# Patient Record
Sex: Female | Born: 1975
Health system: Southern US, Community
[De-identification: ages and names within clinical notes are randomized; demographics above are authoritative.]

## PROBLEM LIST (undated history)

## (undated) DIAGNOSIS — J45909 Unspecified asthma, uncomplicated: Secondary | ICD-10-CM

## (undated) DIAGNOSIS — K219 Gastro-esophageal reflux disease without esophagitis: Secondary | ICD-10-CM

## (undated) DIAGNOSIS — G8929 Other chronic pain: Secondary | ICD-10-CM

## (undated) DIAGNOSIS — G43909 Migraine, unspecified, not intractable, without status migrainosus: Secondary | ICD-10-CM

## (undated) HISTORY — PX: APPENDECTOMY: SHX54

## (undated) HISTORY — DX: Gastro-esophageal reflux disease without esophagitis: K21.9

---

## 1998-06-26 ENCOUNTER — Emergency Department (HOSPITAL_COMMUNITY): Admission: EM | Admit: 1998-06-26 | Discharge: 1998-06-26 | Payer: Self-pay | Admitting: *Deleted

## 1998-09-15 ENCOUNTER — Emergency Department (HOSPITAL_COMMUNITY): Admission: EM | Admit: 1998-09-15 | Discharge: 1998-09-15 | Payer: Self-pay | Admitting: Emergency Medicine

## 2003-07-30 ENCOUNTER — Emergency Department (HOSPITAL_COMMUNITY): Admission: EM | Admit: 2003-07-30 | Discharge: 2003-07-31 | Payer: Self-pay | Admitting: Emergency Medicine

## 2008-03-10 ENCOUNTER — Emergency Department (HOSPITAL_BASED_OUTPATIENT_CLINIC_OR_DEPARTMENT_OTHER): Admission: EM | Admit: 2008-03-10 | Discharge: 2008-03-10 | Payer: Self-pay | Admitting: Emergency Medicine

## 2009-11-15 ENCOUNTER — Ambulatory Visit: Payer: Self-pay | Admitting: Diagnostic Radiology

## 2009-11-15 ENCOUNTER — Emergency Department (HOSPITAL_BASED_OUTPATIENT_CLINIC_OR_DEPARTMENT_OTHER): Admission: EM | Admit: 2009-11-15 | Discharge: 2009-11-15 | Payer: Self-pay | Admitting: Emergency Medicine

## 2010-05-22 ENCOUNTER — Emergency Department (HOSPITAL_BASED_OUTPATIENT_CLINIC_OR_DEPARTMENT_OTHER): Admission: EM | Admit: 2010-05-22 | Discharge: 2010-05-22 | Payer: Self-pay | Admitting: Emergency Medicine

## 2010-05-28 ENCOUNTER — Emergency Department (HOSPITAL_BASED_OUTPATIENT_CLINIC_OR_DEPARTMENT_OTHER): Admission: EM | Admit: 2010-05-28 | Discharge: 2010-05-28 | Payer: Self-pay | Admitting: Emergency Medicine

## 2010-06-03 ENCOUNTER — Encounter: Payer: Self-pay | Admitting: Family Medicine

## 2010-07-21 ENCOUNTER — Ambulatory Visit: Payer: Self-pay | Admitting: Family Medicine

## 2010-07-21 DIAGNOSIS — R946 Abnormal results of thyroid function studies: Secondary | ICD-10-CM

## 2010-07-21 DIAGNOSIS — K219 Gastro-esophageal reflux disease without esophagitis: Secondary | ICD-10-CM | POA: Insufficient documentation

## 2010-07-21 DIAGNOSIS — G43829 Menstrual migraine, not intractable, without status migrainosus: Secondary | ICD-10-CM | POA: Insufficient documentation

## 2010-07-21 DIAGNOSIS — A048 Other specified bacterial intestinal infections: Secondary | ICD-10-CM | POA: Insufficient documentation

## 2010-07-21 DIAGNOSIS — T7840XA Allergy, unspecified, initial encounter: Secondary | ICD-10-CM | POA: Insufficient documentation

## 2010-07-22 ENCOUNTER — Telehealth (INDEPENDENT_AMBULATORY_CARE_PROVIDER_SITE_OTHER): Payer: Self-pay | Admitting: *Deleted

## 2010-07-22 ENCOUNTER — Encounter (INDEPENDENT_AMBULATORY_CARE_PROVIDER_SITE_OTHER): Payer: Self-pay | Admitting: *Deleted

## 2010-07-23 ENCOUNTER — Telehealth (INDEPENDENT_AMBULATORY_CARE_PROVIDER_SITE_OTHER): Payer: Self-pay | Admitting: *Deleted

## 2010-08-26 ENCOUNTER — Ambulatory Visit: Payer: Self-pay | Admitting: Family Medicine

## 2010-08-26 ENCOUNTER — Ambulatory Visit: Payer: Self-pay | Admitting: Diagnostic Radiology

## 2010-08-26 ENCOUNTER — Ambulatory Visit (HOSPITAL_BASED_OUTPATIENT_CLINIC_OR_DEPARTMENT_OTHER): Admission: RE | Admit: 2010-08-26 | Discharge: 2010-08-26 | Payer: Self-pay | Admitting: Family Medicine

## 2010-08-26 DIAGNOSIS — R0989 Other specified symptoms and signs involving the circulatory and respiratory systems: Secondary | ICD-10-CM | POA: Insufficient documentation

## 2010-08-26 DIAGNOSIS — F4321 Adjustment disorder with depressed mood: Secondary | ICD-10-CM

## 2010-08-27 ENCOUNTER — Telehealth (INDEPENDENT_AMBULATORY_CARE_PROVIDER_SITE_OTHER): Payer: Self-pay | Admitting: *Deleted

## 2010-08-27 LAB — CONVERTED CEMR LAB
ALT: 43 units/L — ABNORMAL HIGH (ref 0–35)
BUN: 7 mg/dL (ref 6–23)
Basophils Absolute: 0 10*3/uL (ref 0.0–0.1)
Bilirubin, Direct: 0.1 mg/dL (ref 0.0–0.3)
Cholesterol: 173 mg/dL (ref 0–200)
Creatinine, Ser: 0.6 mg/dL (ref 0.4–1.2)
Eosinophils Relative: 1.3 % (ref 0.0–5.0)
GFR calc non Af Amer: 115.74 mL/min (ref >60)
LDL Cholesterol: 110 mg/dL — ABNORMAL HIGH (ref 0–99)
MCV: 84.2 fL (ref 78.0–100.0)
Monocytes Absolute: 0.5 10*3/uL (ref 0.1–1.0)
Monocytes Relative: 6.8 % (ref 3.0–12.0)
Neutrophils Relative %: 71.1 % (ref 43.0–77.0)
Platelets: 310 10*3/uL (ref 150.0–400.0)
RDW: 13 % (ref 11.5–14.6)
Total Bilirubin: 0.7 mg/dL (ref 0.3–1.2)
Triglycerides: 91 mg/dL (ref 0.0–149.0)
VLDL: 18.2 mg/dL (ref 0.0–40.0)
WBC: 7.8 10*3/uL (ref 4.5–10.5)

## 2010-11-04 ENCOUNTER — Emergency Department (HOSPITAL_BASED_OUTPATIENT_CLINIC_OR_DEPARTMENT_OTHER)
Admission: EM | Admit: 2010-11-04 | Discharge: 2010-11-04 | Payer: Self-pay | Source: Home / Self Care | Admitting: Emergency Medicine

## 2010-11-04 LAB — BASIC METABOLIC PANEL
BUN: 11 mg/dL (ref 6–23)
Calcium: 9.2 mg/dL (ref 8.4–10.5)
Creatinine, Ser: 0.6 mg/dL (ref 0.4–1.2)
GFR calc non Af Amer: >60 mL/min (ref >60)
Glucose, Bld: 90 mg/dL (ref 70–99)

## 2010-11-09 NOTE — Assessment & Plan Note (Signed)
Summary: CPX AND LABS/CDJ   Vital Signs:  Patient profile:   35 year old female Height:      58 inches Weight:      160 pounds BMI:     33.56 Pulse rate:   106 / minute BP sitting:   120 / 70  (left arm)  Vitals Entered By: Doristine Devoid CMA (August 26, 2010 8:14 AM) CC: CPX AND LABS   History of Present Illness: 35 yo woman here today for CPE.  GYN- Dr Gerilyn Pilgrim, UTD on pap.    1) mood swings- finds herself 'overwhelmed', 'i'm short w/ people', 'i don't feel right'.  having difficulty staying asleep.  denies increased eating.  denies withdrawing from people and things she enjoys.  feels this is related to her ongoing allergic rxns.  Preventive Screening-Counseling & Management  Alcohol-Tobacco     Alcohol drinks/day: <1     Smoking Status: never  Caffeine-Diet-Exercise     Does Patient Exercise: no      Sexual History:  currently monogamous.        Drug Use:  never.    Current Medications (verified): 1)  Maxalt 5 Mg Tabs (Rizatriptan Benzoate) .Marland Kitchen.. 1 Tab As Needed For Migraine.  May Repeat in 2 Hrs.  Disp 1 Month. 2)  Zyrtec Allergy 10 Mg Tabs (Cetirizine Hcl) .... Take Two Times A Day 3)  Ranitidine Hcl 150 Mg Caps (Ranitidine Hcl) .... Take Tablet Daily 4)  Doxepin Hcl 10 Mg Caps (Doxepin Hcl) .... Take Nightly As Directed  Allergies (verified): 1)  ! Amoxicillin  Past History:  Past medical, surgical, family and social histories (including risk factors) reviewed, and no changes noted (except as noted below).  Past Medical History: Reviewed history from 07/21/2010 and no changes required. allergic skin rash migraines GERD  Past Surgical History: Reviewed history from 07/21/2010 and no changes required. Appendectomy Caesarean section  Family History: Reviewed history from 07/21/2010 and no changes required. CAD-no HTN-no DM-no STROKE-maternal grandmother COLON CA-no BREAST CA-no  Social History: Reviewed history from 07/21/2010 and no changes  required. married 3 girls scheduler at [employer]  Review of Systems       The patient complains of chest pain, peripheral edema, and severe indigestion/heartburn.  The patient denies anorexia, fever, weight loss, weight gain, vision loss, decreased hearing, hoarseness, syncope, dyspnea on exertion, prolonged cough, headaches, abdominal pain, melena, hematochezia, hematuria, suspicious skin lesions, depression, abnormal bleeding, enlarged lymph nodes, and breast masses.         CP w/ reflux edema w/ allergic rxns  Physical Exam  General:  obviously uncomfortable Head:  Normocephalic and atraumatic without obvious abnormalities. No apparent alopecia or balding. Eyes:  No corneal or conjunctival inflammation noted. EOMI. Perrla. Funduscopic exam benign, without hemorrhages, exudates or papilledema. Vision grossly normal. Ears:  External ear exam shows no significant lesions or deformities.  Otoscopic examination reveals clear canals, tympanic membranes are intact bilaterally without bulging, retraction, inflammation or discharge. Hearing is grossly normal bilaterally. Nose:  External nasal examination shows no deformity or inflammation. Nasal mucosa are pink and moist without lesions or exudates. Mouth:  large tonsils bilaterally, otherwise normal during visit lips became red and started swelling Neck:  No deformities, masses, or tenderness noted. Breasts:  deferred to GYN Lungs:  coarse BS R upper lobe otherwise CTA Heart:  Normal rate and regular rhythm. S1 and S2 normal without gallop, murmur, click, rub or other extra sounds. Abdomen:  Bowel sounds positive,abdomen soft and non-tender without masses, organomegaly  or hernias noted. Genitalia:  deferred to GYN Pulses:  +2 carotid, radial, DP Extremities:  no C/C/E Neurologic:  alert & oriented X3, cranial nerves II-XII intact, strength normal in all extremities, gait normal, and DTRs symmetrical and normal.   Skin:  + dermatographia, hives,  lips starting to swell Cervical Nodes:  No lymphadenopathy noted Axillary Nodes:  No palpable lymphadenopathy Psych:  anxious, uncomfortable   Impression & Recommendations:  Problem # 1:  PHYSICAL EXAMINATION (ICD-V70.0) Assessment New pt's PE WNL w/ exception of hives and dermatographia.  check labs. Orders: Venipuncture (83151) T-Vitamin D (25-Hydroxy) (76160-73710) Specimen Handling (62694) TLB-Lipid Panel (80061-LIPID) TLB-BMP (Basic Metabolic Panel-BMET) (80048-METABOL) TLB-CBC Platelet - w/Differential (85025-CBCD) TLB-Hepatic/Liver Function Pnl (80076-HEPATIC)  Problem # 2:  ADJUSTMENT DISORDER WITH DEPRESSED MOOD (ICD-309.0) Assessment: New pt obviously upset and anxious about her ongoing allergic rxns.  at this time will not start meds as this is an understandable response to her physical discomfort but advised her to call me if she feels at any time she would like help w/ her mood.  Pt expresses understanding and is in agreement w/ this plan.  Problem # 3:  ALLERGIC REACTION (ICD-995.3) Assessment: Unchanged pt developing worsening hives during exam and then had lip swelling.  called allergist to ask permission to tx w/ steroids given the involvement of the lips.  Depo medrol given. Orders: Depo- Medrol 80mg  (J1040) Admin of Therapeutic Inj  intramuscular or subcutaneous (85462)  Problem # 4:  ABNORMAL BREATH SOUNDS (ICD-786.7) Assessment: New coarse BS in RUL.  check xray. Orders: T-2 View CXR (71020TC)  Problem # 5:  GERD (ICD-530.81) Assessment: Unchanged  Her updated medication list for this problem includes:    Ranitidine Hcl 150 Mg Caps (Ranitidine hcl) .Marland Kitchen... Take tablet daily  Complete Medication List: 1)  Maxalt 5 Mg Tabs (Rizatriptan benzoate) .Marland Kitchen.. 1 tab as needed for migraine.  may repeat in 2 hrs.  disp 1 month. 2)  Zyrtec Allergy 10 Mg Tabs (Cetirizine hcl) .... Take two times a day 3)  Ranitidine Hcl 150 Mg Caps (Ranitidine hcl) .... Take tablet  daily 4)  Doxepin Hcl 10 Mg Caps (Doxepin hcl) .... Take nightly as directed  Patient Instructions: 1)  Schedule a lab visit in 6 weeks to recheck your thyroid (TSH, free T3/T4- dx 794.5) 2)  If your mood continues to be an issue- call me!!  I can certainly see how you'd be depressed! 3)  Please go get your chest xray at [address] or the [facility] at [address]- we'll call you with the results 4)  We'll call you with your lab results 5)  If you continue to swell- please go to your allergy appt early 6)  Call with any questions or concerns 7)  Hang in there! 8)  Happy Holidays!   Medication Administration  Injection # 1:    Medication: Depo- Medrol 80mg     Diagnosis: ALLERGIC REACTION (ICD-995.3)    Route: IM    Site: R deltoid    Exp Date: 04/10/2011    Lot #: [id number]    Mfr: Pharmacia    Given by: Doristine Devoid CMA (August 26, 2010 9:50 AM)  Orders Added: 1)  T-2 View CXR [71020TC] 2)  Venipuncture [09381] 3)  T-Vitamin D (25-Hydroxy) [82993-71696] 4)  Specimen Handling [99000] 5)  Depo- Medrol 80mg  [J1040] 6)  Admin of Therapeutic Inj  intramuscular or subcutaneous [96372] 7)  TLB-Lipid Panel [80061-LIPID] 8)  TLB-BMP (Basic Metabolic Panel-BMET) [  80048-METABOL] 9)  TLB-CBC Platelet - w/Differential [85025-CBCD] 10)  TLB-Hepatic/Liver Function Pnl [80076-HEPATIC] 11)  Est. Patient 18-39 years [99395]     Medication Administration  Injection # 1:    Medication: Depo- Medrol 80mg     Diagnosis: ALLERGIC REACTION (ICD-995.3)    Route: IM    Site: R deltoid    Exp Date: 04/10/2011    Lot #: [id number]    Mfr: Pharmacia    Given by: Doristine Devoid CMA (August 26, 2010 9:50 AM)  Orders Added: 1)  T-2 View CXR [71020TC] 2)  Venipuncture [60454] 3)  T-Vitamin D (25-Hydroxy) [09811-91478] 4)  Specimen Handling [99000] 5)  Depo- Medrol 80mg  [J1040] 6)  Admin of Therapeutic Inj  intramuscular or subcutaneous [96372] 7)  TLB-Lipid Panel  [80061-LIPID] 8)  TLB-BMP (Basic Metabolic Panel-BMET) [80048-METABOL] 9)  TLB-CBC Platelet - w/Differential [85025-CBCD] 10)  TLB-Hepatic/Liver Function Pnl [80076-HEPATIC] 11)  Est. Patient 18-39 years [29562]

## 2010-11-09 NOTE — Letter (Signed)
Summary: Work Dietitian at [employer]  [address] [place], [place] [address]   Phone: [phone]  Fax: [phone]    Today's Date: July 22, 2010  Name of Patient: Michele Roberts  The above named patient had a medical visit today at:  am / 3:00 pm.  Please take this into consideration when reviewing the time away from work/school.    Special Instructions:  [ X ] None  [  ] To be off the remainder of today, returning to the normal work / school schedule tomorrow.  [  ] To be off until the next scheduled appointment on ______________________.  [  ] Other ________________________________________________________________ ________________________________________________________________________   Sincerely yours,   Doristine Devoid CMA

## 2010-11-09 NOTE — Progress Notes (Signed)
Summary: labs  Phone Note Outgoing Call   Call placed by: Doristine Devoid CMA,  August 27, 2010 3:10 PM Call placed to: Patient Summary of Call: level is low.  needs to start 50,000 units weekly x12 weeks and then recheck level  Follow-up for Phone Call        left message on machine .......Marland KitchenDoristine Devoid CMA  August 27, 2010 3:10 PM   left message on machine ...........Marland KitchenDoristine Devoid CMA  August 31, 2010 2:00 PM   will mail information and send prescription to the pharmacy.........Marland KitchenDoristine Devoid CMA  September 07, 2010 8:59 AM     New/Updated Medications: VITAMIN D (ERGOCALCIFEROL) 50000 UNIT CAPS (ERGOCALCIFEROL) take one tablet weekly x12 weeks Prescriptions: VITAMIN D (ERGOCALCIFEROL) 50000 UNIT CAPS (ERGOCALCIFEROL) take one tablet weekly x12 weeks  #12 x 0   Entered by:   Doristine Devoid CMA   Authorized by:   Neena Rhymes MD   Signed by:   Doristine Devoid CMA on 09/07/2010   Method used:   [facility]  [address] [phone][facility]ail[facility]       [address]       [place], [place]  [address]       Ph: [phone] or [phone]       Fax: [phone]   RxID:   [id number]

## 2010-11-09 NOTE — Progress Notes (Signed)
Summary: more specific instruction for maxalt  Phone Note Refill Request Message from:  Fax from Pharmacy on July 22, 2010 9:10 AM  Refills Requested: Medication #1:  MAXALT 5 MG TABS 1 tab as needed for migraine.  may repeat in 2 hrs.  disp 1 month. [facility] [phone] note "quantity 1 month please be more specific"  Initial call taken by: Okey Regal [place],  July 22, 2010 9:13 AM  Follow-up for Phone Call        informed a quantity of 12 or whatever insurance will allow.......Marland KitchenDoristine Devoid CMA  July 22, 2010 9:28 AM

## 2010-11-09 NOTE — Progress Notes (Signed)
Summary: lmom 10-14,10-19 TRY LATER-lab results-  Phone Note Outgoing Call   Call placed by: [facility] CMA,  July 23, 2010 10:22 AM Details for Reason: pt is allergic to dogs, cats, and grass- but dogs worse than the others.  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  should avoid shellfish if possible Summary of Call: left message to call office.................Marland KitchenFelecia Deloach CMA  July 23, 2010 10:22 AM  Tried to call pt message on phone stating pt unable to answer phone pls try again later. Will try later..........Marland KitchenFelecia Deloach CMA  July 28, 2010 10:16 AM    Follow-up for Phone Call        spoke w/ patient aware of labs wanted to know what she should do informed patient she could f/u w/ allergist she would like to be referred to another allergist to further evaluate and appt scheduled for cpx.......Marland KitchenDoristine Devoid CMA  July 28, 2010 2:33 PM

## 2010-11-09 NOTE — Assessment & Plan Note (Signed)
Summary: NEW TO ESTAB, HAS RASH PROBLEMS///SPH   Vital Signs:  Patient profile:   35 year old female Height:      58 inches Weight:      158 pounds BMI:     33.14 Pulse rate:   97 / minute BP sitting:   112 / 70  (left arm)  Vitals Entered By: Doristine Devoid CMA (July 21, 2010 3:43 PM) CC: NEW EST- breakout all over body x2 months ago   History of Present Illness: 35 yo woman here today to establish care.  previous MD- [facility].  Migraines- takes Maxalt as needed.  typically menstrual migraines.  will take Excedrin for Migraine early in onset.  has never seen neurology.  will have auras.  + nausea, vomiting.  will end up in ER  ~2x/yr for severe headaches.  has had MRI- normal.  rash- saw Dr Beaulah Dinning (allergist).  was going to have allergy test but was taking Zyrtec.  was found to be H pylori +, this was treated.  if she stops taking Zyrtec will develop rash 'all over'.  rash looks like 'scratches'- 'red streaks all over my body'.  insomnia- taking doxepin for sleep and itching.  not taking every night, only as needed (typically when rash is present).  reports good relief.  abnormal thyroid test- according to labs done on 8/25 pt has slightly elevated T4 at 12.8  + H pylori- pt reports she has taken the medication as directed and would like to know if this has resolved.   Preventive Screening-Counseling & Management  Alcohol-Tobacco     Alcohol drinks/day: <1     Smoking Status: never  Caffeine-Diet-Exercise     Does Patient Exercise: no      Sexual History:  currently monogamous.        Drug Use:  never.    Current Medications (verified): 1)  Maxalt 5 Mg Tabs (Rizatriptan Benzoate) .Marland Kitchen.. 1 Tab As Needed For Migraine.  May Repeat in 2 Hrs.  Disp 1 Month. 2)  Zyrtec Allergy 10 Mg Tabs (Cetirizine Hcl) .... Take Two Times A Day 3)  Ranitidine Hcl 150 Mg Caps (Ranitidine Hcl) .... Take Tablet Daily 4)  Doxepin Hcl 10 Mg Caps (Doxepin Hcl) .... Take Nightly As  Directed  Allergies (verified): 1)  ! 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 Lungs:  Normal respiratory effort, chest expands symmetrically. Lungs are clear to auscultation, no crackles or wheezes. Heart:  Normal rate and regular rhythm. S1 and S2 normal without gallop, murmur, click, rub or other extra sounds. Abdomen:  soft, NT/ND, +BS Pulses:  +2 carotid, radial, DP Extremities:  no C/C/E Neurologic:  alert & oriented X3, cranial nerves II-XII intact, strength normal in all extremities, gait normal, and DTRs symmetrical and normal.   Skin:  no visible rash today   Impression & Recommendations:  Problem # 1:  ALLERGIC REACTION (ICD-995.3) Assessment New pt's rash and dermatographia consistent w/ intense allergic rxn.  zyrtec is controlling  sxs currently but pt would like to know what is triggering response.  will do allergy testing to attempt to figure this out. Orders: Venipuncture (51025) T-Food Allergy Profile Specific IgE (86003/[id number]) T-Simms Allergy Panel (86003/[id number]) Specimen Handling (85277)  Problem # 2:  MIGRAINE, MENSTRUAL (ICD-346.40) Assessment: New pt has had normal MRI.  migraines consistent w/ menstrual migraines.  continue maxalt as needed. Her updated medication list for this problem includes:    Maxalt 5 Mg Tabs (Rizatriptan benzoate) .Marland Kitchen... 1 tab as needed for  migraine.  may repeat in 2 hrs.  disp 1 month.  Problem # 3:  HELICOBACTER PYLORI INFECTION (ICD-041.86) Assessment: New pt w/ recent H pylori.  took meds as directed.  would like test of cure. Orders: TLB-H. Pylori Abs(Helicobacter Pylori) (86677-HELICO) Specimen Handling (82423)  Problem # 4:  THYROID FUNCTION TEST, ABNORMAL (ICD-794.5) Assessment: New pt's T4 elevated on recent lab work.  repeat today. Orders: TLB-TSH (Thyroid Stimulating Hormone) (84443-TSH) TLB-T4 (Thyrox), Free [phone]) TLB-T3, Free (Triiodothyronine) (84481-T3FREE) Specimen Handling (40086)  Complete Medication List: 1)  Maxalt 5 Mg Tabs (Rizatriptan benzoate) .Marland Kitchen.. 1 tab as needed for migraine.  may repeat in 2 hrs.  disp 1 month. 2)  Zyrtec Allergy 10 Mg Tabs (Cetirizine hcl) .... Take two times a day 3)  Ranitidine Hcl 150 Mg Caps (Ranitidine hcl) .... Take tablet daily 4)  Doxepin Hcl 10 Mg Caps (Doxepin hcl) .... Take nightly as directed  Patient Instructions: 1)  Please schedule your complete physical at your convenience- do not eat before this appt 2)  We'll notify you of your lab results 3)  Keep taking the Zyrtec EVERY DAY! 4)  Call with any questions or concerns 5)  Hang in there! 6)  Welcome!  We're glad to have you! Prescriptions: MAXALT 5 MG TABS (RIZATRIPTAN BENZOATE) 1 tab as needed for migraine.  may repeat in 2 hrs.  disp 1 month.  #1 month x 6   Entered and Authorized by:   Neena Rhymes MD   Signed by:   Neena Rhymes MD on 07/21/2010   Method used:   [facility]        [facility]  [address] [phone][facility]       [address]       [place], [place]  [address]       Ph: [phone] or [phone]       Fax: [phone]   RxID:   [id number]

## 2010-11-11 NOTE — Consult Note (Signed)
Summary: [facility]  [facility]   Imported By: Lanelle Bal 10/07/2010 09:30:39  _____________________________________________________________________  External Attachment:    Type:   Image     Comment:   External Document

## 2011-02-01 ENCOUNTER — Telehealth: Payer: Self-pay | Admitting: Family Medicine

## 2011-02-01 ENCOUNTER — Emergency Department (HOSPITAL_BASED_OUTPATIENT_CLINIC_OR_DEPARTMENT_OTHER)
Admission: EM | Admit: 2011-02-01 | Discharge: 2011-02-01 | Disposition: A | Discharge status: Home / Self Care | Payer: BC Managed Care – PPO | Attending: Emergency Medicine | Admitting: Emergency Medicine

## 2011-02-01 ENCOUNTER — Emergency Department (INDEPENDENT_AMBULATORY_CARE_PROVIDER_SITE_OTHER): Payer: BC Managed Care – PPO

## 2011-02-01 DIAGNOSIS — M542 Cervicalgia: Secondary | ICD-10-CM

## 2011-02-01 DIAGNOSIS — R51 Headache: Secondary | ICD-10-CM

## 2011-02-01 NOTE — Telephone Encounter (Signed)
Patient called to see if Dr Beverely Low could see her today---yesterday she had pain started in rt hand and going up to neck, felt pressure, when pressure released, she felt arm was heavy and cold---feeling lightheaded, now rt leg is showing same symptoms, also has strange headache---says she has a "strange headache"

## 2011-02-01 NOTE — Telephone Encounter (Signed)
Agree w/ ER evaluation

## 2011-02-01 NOTE — Telephone Encounter (Signed)
Spoke w/ pt informed she should be seen in ER pt ok'd information and will go to [facility].

## 2011-06-06 ENCOUNTER — Encounter: Payer: Self-pay | Admitting: Family Medicine

## 2011-06-06 ENCOUNTER — Emergency Department (INDEPENDENT_AMBULATORY_CARE_PROVIDER_SITE_OTHER): Payer: BC Managed Care – PPO

## 2011-06-06 ENCOUNTER — Emergency Department (HOSPITAL_BASED_OUTPATIENT_CLINIC_OR_DEPARTMENT_OTHER)
Admission: EM | Admit: 2011-06-06 | Discharge: 2011-06-06 | Disposition: A | Discharge status: Home / Self Care | Payer: BC Managed Care – PPO | Attending: Emergency Medicine | Admitting: Emergency Medicine

## 2011-06-06 DIAGNOSIS — M542 Cervicalgia: Secondary | ICD-10-CM

## 2011-06-06 DIAGNOSIS — S161XXA Strain of muscle, fascia and tendon at neck level, initial encounter: Secondary | ICD-10-CM

## 2011-06-06 DIAGNOSIS — S139XXA Sprain of joints and ligaments of unspecified parts of neck, initial encounter: Secondary | ICD-10-CM | POA: Insufficient documentation

## 2011-06-06 DIAGNOSIS — S20219A Contusion of unspecified front wall of thorax, initial encounter: Secondary | ICD-10-CM | POA: Insufficient documentation

## 2011-06-06 DIAGNOSIS — M545 Low back pain: Secondary | ICD-10-CM

## 2011-06-06 DIAGNOSIS — Y92410 Unspecified street and highway as the place of occurrence of the external cause: Secondary | ICD-10-CM | POA: Insufficient documentation

## 2011-06-06 DIAGNOSIS — M47817 Spondylosis without myelopathy or radiculopathy, lumbosacral region: Secondary | ICD-10-CM

## 2011-06-06 DIAGNOSIS — R079 Chest pain, unspecified: Secondary | ICD-10-CM

## 2011-06-06 DIAGNOSIS — R51 Headache: Secondary | ICD-10-CM

## 2011-06-06 HISTORY — DX: Migraine, unspecified, not intractable, without status migrainosus: G43.909

## 2011-06-06 LAB — URINALYSIS, ROUTINE W REFLEX MICROSCOPIC
Glucose, UA: NEGATIVE mg/dL
Ketones, ur: NEGATIVE mg/dL
Leukocytes, UA: NEGATIVE
Nitrite: NEGATIVE
Specific Gravity, Urine: 1.007 (ref 1.005–1.030)
pH: 7 (ref 5.0–8.0)

## 2011-06-06 LAB — PREGNANCY, URINE: Preg Test, Ur: NEGATIVE

## 2011-06-06 MED ORDER — CYCLOBENZAPRINE HCL 10 MG PO TABS: 10.0000 mg | ORAL_TABLET | Freq: Two times a day (BID) | ORAL | Status: AC | PRN

## 2011-06-06 MED ORDER — HYDROCODONE-ACETAMINOPHEN 5-325 MG PO TABS
2.0000 | ORAL_TABLET | Freq: Once | ORAL | Status: AC
  Administered 2011-06-06: 2 via ORAL
  Filled 2011-06-06: qty 2

## 2011-06-06 MED ORDER — ONDANSETRON 8 MG PO TBDP
8.0000 mg | ORAL_TABLET | Freq: Once | ORAL | Status: AC
  Administered 2011-06-06: 8 mg via ORAL
  Filled 2011-06-06: qty 1

## 2011-06-06 MED ORDER — IBUPROFEN 800 MG PO TABS: 800.0000 mg | ORAL_TABLET | Freq: Three times a day (TID) | ORAL | Status: AC
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
## 2011-06-06 NOTE — ED Notes (Signed)
Pt attempting to void for urine specimen at this time.

## 2011-06-06 NOTE — ED Notes (Signed)
MD at bedside. 

## 2011-07-07 LAB — POCT CARDIAC MARKERS
Myoglobin, poc: 34.6
Operator id: 5513
Troponin i, poc: <0.05

## 2011-07-07 LAB — BASIC METABOLIC PANEL
Calcium: 9.3
GFR calc Af Amer: >60
GFR calc non Af Amer: >60
Potassium: 3.7
Sodium: 141

## 2011-07-07 LAB — DIFFERENTIAL
Basophils Absolute: 0.1
Eosinophils Relative: 2
Lymphocytes Relative: 25
Lymphs Abs: 1.8
Monocytes Absolute: 0.5
Neutro Abs: 4.9

## 2011-07-07 LAB — CBC
HCT: 35.9 — ABNORMAL LOW
Hemoglobin: 12.5
RBC: 4.35
WBC: 7.4

## 2011-07-07 LAB — PROTIME-INR: INR: 1.1

## 2011-08-25 ENCOUNTER — Encounter: Payer: Self-pay | Admitting: Family Medicine

## 2011-08-29 ENCOUNTER — Ambulatory Visit (INDEPENDENT_AMBULATORY_CARE_PROVIDER_SITE_OTHER): Payer: BC Managed Care – PPO | Admitting: Family Medicine

## 2011-08-29 ENCOUNTER — Encounter: Payer: Self-pay | Admitting: Family Medicine

## 2011-08-29 DIAGNOSIS — F4321 Adjustment disorder with depressed mood: Secondary | ICD-10-CM

## 2011-08-29 DIAGNOSIS — K589 Irritable bowel syndrome without diarrhea: Secondary | ICD-10-CM | POA: Insufficient documentation

## 2011-08-29 DIAGNOSIS — Z0000 Encounter for general adult medical examination without abnormal findings: Secondary | ICD-10-CM | POA: Insufficient documentation

## 2011-08-29 LAB — CBC WITH DIFFERENTIAL/PLATELET
Basophils Absolute: 0 10*3/uL (ref 0.0–0.1)
Eosinophils Relative: 1.2 % (ref 0.0–5.0)
HCT: 34.7 % — ABNORMAL LOW (ref 36.0–46.0)
Lymphs Abs: 1.9 10*3/uL (ref 0.7–4.0)
MCV: 84.2 fl (ref 78.0–100.0)
Monocytes Absolute: 0.5 10*3/uL (ref 0.1–1.0)
Monocytes Relative: 6.6 % (ref 3.0–12.0)
Neutrophils Relative %: 65.4 % (ref 43.0–77.0)
Platelets: 281 10*3/uL (ref 150.0–400.0)
RDW: 13.1 % (ref 11.5–14.6)
WBC: 7.4 10*3/uL (ref 4.5–10.5)

## 2011-08-29 LAB — HEPATIC FUNCTION PANEL
AST: 22 U/L (ref 0–37)
Alkaline Phosphatase: 57 U/L (ref 39–117)
Bilirubin, Direct: 0 mg/dL (ref 0.0–0.3)

## 2011-08-29 LAB — BASIC METABOLIC PANEL
Calcium: 8.7 mg/dL (ref 8.4–10.5)
GFR: 115.1 mL/min (ref >60.00)
Glucose, Bld: 95 mg/dL (ref 70–99)
Potassium: 3.7 mEq/L (ref 3.5–5.1)
Sodium: 139 mEq/L (ref 135–145)

## 2011-08-29 LAB — LIPID PANEL
HDL: 45.5 mg/dL (ref >39.00)
Total CHOL/HDL Ratio: 3
VLDL: 11.2 mg/dL (ref 0.0–40.0)

## 2011-08-29 LAB — TSH: TSH: 0.45 u[IU]/mL (ref 0.35–5.50)

## 2011-08-29 MED ORDER — CLONAZEPAM 0.5 MG PO TABS: 0.5000 mg | ORAL_TABLET | Freq: Two times a day (BID) | ORAL | Status: DC

## 2011-08-29 MED ORDER — DICYCLOMINE HCL 20 MG PO TABS: 20.0000 mg | ORAL_TABLET | Freq: Three times a day (TID) | ORAL | Status: DC | PRN

## 2011-08-29 NOTE — Progress Notes (Signed)
  Subjective:    Patient ID: Michele Roberts, female    DOB: Oct 24, 1975, 35 y.o.   MRN: [id number]  HPI CPE- UTD on pap w/ GYN (Dr Gerilyn Pilgrim, [facility]).  Gas/bloating- having irregular BMs, admits to increased stress.  Insomnia- not sleeping due to stress, husband left her 2 weeks ago.  Has not been in counseling.   Review of Systems Patient reports no vision/ hearing changes, adenopathy,fever, weight change,  persistant/recurrent hoarseness , swallowing issues, chest pain, palpitations, edema, persistant/recurrent cough, hemoptysis, dyspnea (rest/exertional/paroxysmal nocturnal), gastrointestinal bleeding (melena, rectal bleeding), abdominal pain, significant heartburn, bowel changes, GU symptoms (dysuria, hematuria, incontinence), Gyn symptoms (abnormal  bleeding, pain),  syncope, focal weakness, memory loss, numbness & tingling, skin/hair/nail changes, abnormal bruising or bleeding.    Objective:   Physical Exam General Appearance:    Alert, cooperative, no distress, appears stated age  Head:    Normocephalic, without obvious abnormality, atraumatic  Eyes:    PERRL, conjunctiva/corneas clear, EOM's intact, fundi    benign, both eyes  Ears:    Normal TM's and external ear canals, both ears  Nose:   Nares normal, septum midline, mucosa normal, no drainage    or sinus tenderness  Throat:   Lips, mucosa, and tongue normal; teeth and gums normal  Neck:   Supple, symmetrical, trachea midline, no adenopathy;    Thyroid: no enlargement/tenderness/nodules  Back:     Symmetric, no curvature, ROM normal, no CVA tenderness  Lungs:     Clear to auscultation bilaterally, respirations unlabored  Chest Wall:    No tenderness or deformity   Heart:    Regular rate and rhythm, S1 and S2 normal, no murmur, rub   or gallop  Breast Exam:    Deferred to GYN  Abdomen:     Soft, non-tender, bowel sounds active all four quadrants,    no masses, no organomegaly  Genitalia:    Deferred to GYN  Rectal:      Extremities:   Extremities normal, atraumatic, no cyanosis or edema  Pulses:   2+ and symmetric all extremities  Skin:   Skin color, texture, turgor normal, no rashes or lesions  Lymph nodes:   Cervical, supraclavicular, and axillary nodes normal  Neurologic:   CNII-XII intact, normal strength, sensation and reflexes    throughout          Assessment & Plan:

## 2011-08-29 NOTE — Patient Instructions (Signed)
Follow up in 1 month to recheck mood Consider starting counseling Start the Bentyl as needed for abdominal spasm Drink plenty of water Increase your fiber intake Use the Klonopin at night to help w/ sleep and anxiety We'll notify you of your lab results Call with any questions or concerns Hang in there! Happy Thanksgiving to you and the girls!!!

## 2011-08-30 ENCOUNTER — Encounter: Payer: Self-pay | Admitting: *Deleted

## 2011-08-31 LAB — VITAMIN D 1,25 DIHYDROXY: Vitamin D 1, 25 (OH)2 Total: 75 pg/mL — ABNORMAL HIGH (ref 18–72)

## 2011-09-06 ENCOUNTER — Encounter: Payer: Self-pay | Admitting: *Deleted

## 2011-09-11 NOTE — Assessment & Plan Note (Signed)
Pt's sxs of gas and bloating are most likely IBS due to increased recent stress.  Start Bentyl for sxs relief.  Reviewed lifestyle modifications.  Will follow.

## 2011-09-11 NOTE — Assessment & Plan Note (Signed)
Pt's PE WNL.  UTD on health maintenance.  Check labs.  Anticipatory guidance provided.  

## 2011-09-11 NOTE — Assessment & Plan Note (Addendum)
Pt having difficult time w/ recent separation- feels there is someone else.  Trying to hold it together for her 3 girls.  Not interested in controller med at this time, but open to idea of klonopin at night to help w/ sleep and anxiety.  Will consider counseling.  Will follow closely.

## 2011-09-29 ENCOUNTER — Ambulatory Visit: Payer: BC Managed Care – PPO | Admitting: Family Medicine

## 2011-10-05 ENCOUNTER — Emergency Department (HOSPITAL_BASED_OUTPATIENT_CLINIC_OR_DEPARTMENT_OTHER)
Admission: EM | Admit: 2011-10-05 | Discharge: 2011-10-06 | Disposition: A | Discharge status: Home / Self Care | Payer: BC Managed Care – PPO | Attending: Emergency Medicine | Admitting: Emergency Medicine

## 2011-10-05 ENCOUNTER — Encounter (HOSPITAL_BASED_OUTPATIENT_CLINIC_OR_DEPARTMENT_OTHER): Payer: Self-pay | Admitting: *Deleted

## 2011-10-05 DIAGNOSIS — E669 Obesity, unspecified: Secondary | ICD-10-CM | POA: Insufficient documentation

## 2011-10-05 DIAGNOSIS — J40 Bronchitis, not specified as acute or chronic: Secondary | ICD-10-CM

## 2011-10-05 DIAGNOSIS — R0602 Shortness of breath: Secondary | ICD-10-CM | POA: Insufficient documentation

## 2011-10-05 DIAGNOSIS — K219 Gastro-esophageal reflux disease without esophagitis: Secondary | ICD-10-CM | POA: Insufficient documentation

## 2011-10-05 MED ORDER — PREDNISONE 50 MG PO TABS
60.0000 mg | ORAL_TABLET | Freq: Once | ORAL | Status: AC
  Administered 2011-10-05: 60 mg via ORAL
  Filled 2011-10-05: qty 1

## 2011-10-05 MED ORDER — IPRATROPIUM BROMIDE 0.02 % IN SOLN
0.5000 mg | Freq: Once | RESPIRATORY_TRACT | Status: AC
  Administered 2011-10-05: 0.5 mg via RESPIRATORY_TRACT

## 2011-10-05 MED ORDER — ALBUTEROL SULFATE (5 MG/ML) 0.5% IN NEBU
2.5000 mg | INHALATION_SOLUTION | Freq: Once | RESPIRATORY_TRACT | Status: AC
  Administered 2011-10-05: 2.5 mg via RESPIRATORY_TRACT
  Filled 2011-10-05: qty 0.5

## 2011-10-05 MED ORDER — ALBUTEROL SULFATE (5 MG/ML) 0.5% IN NEBU
INHALATION_SOLUTION | RESPIRATORY_TRACT | Status: AC
  Administered 2011-10-05: 5 mg via RESPIRATORY_TRACT
  Filled 2011-10-05: qty 1

## 2011-10-05 MED ORDER — ALBUTEROL SULFATE (5 MG/ML) 0.5% IN NEBU
5.0000 mg | INHALATION_SOLUTION | Freq: Once | RESPIRATORY_TRACT | Status: AC
  Administered 2011-10-05: 5 mg via RESPIRATORY_TRACT

## 2011-10-05 MED ORDER — IPRATROPIUM BROMIDE 0.02 % IN SOLN
RESPIRATORY_TRACT | Status: AC
  Administered 2011-10-05: 0.5 mg via RESPIRATORY_TRACT
  Filled 2011-10-05: qty 2.5

## 2011-10-05 NOTE — ED Notes (Signed)
Pt c/o SOB hx asthma  

## 2011-10-05 NOTE — ED Notes (Signed)
Dr. J at bedside 

## 2011-10-05 NOTE — ED Provider Notes (Signed)
History     CSN: [id number]  Arrival date & time 10/05/11  2149   First MD Initiated Contact with Patient 10/05/11 2336      Chief Complaint  Patient presents with  . Shortness of Breath    (Consider location/radiation/quality/duration/timing/severity/associated sxs/prior treatment) HPI Complains of nonproductive cough and shortness of breath for 3 days. No fever complains of anterior chest pain worse with coughing. Pain is pleuritic in nature moderate in severity nonradiating no other associated symptoms. Treated with her albuterol inhaler Zyrtec and Benadryl without adequate relief Past Medical History  Diagnosis Date  . Migraines   . GERD (gastroesophageal reflux disease)   . Migraines    asthma  Past Surgical History  Procedure Date  . Appendectomy   . Cesarean section     Family History  Problem Relation Age of Onset  . Stroke Maternal Grandmother   . Hypertension Neg Hx   . Diabetes Neg Hx   . Cancer Neg Hx     History  Substance Use Topics  . Smoking status: Never Smoker   . Smokeless tobacco: Not on file  . Alcohol Use: No   occasional alcohol no illicit drugs  OB History    Grav Para Term Preterm Abortions TAB SAB Ect Mult Living                  Review of Systems  Constitutional: Negative.   HENT: Negative.   Respiratory: Positive for cough and shortness of breath.   Cardiovascular: Negative.   Gastrointestinal: Negative.   Musculoskeletal: Negative.   Skin: Negative.   Neurological: Negative.   Hematological: Negative.   Psychiatric/Behavioral: Negative.   All other systems reviewed and are negative.    Allergies  Amoxicillin; Banana; and Pineapple  Home Medications   Current Outpatient Rx  Name Route Sig Dispense Refill  . CETIRIZINE HCL 10 MG PO TABS Oral Take 10 mg by mouth daily.      . CYCLOBENZAPRINE HCL 10 MG PO TABS Oral Take 10 mg by mouth daily as needed. For muscle spasms     . IBUPROFEN 200 MG PO TABS Oral Take 800 mg  by mouth every 6 (six) hours as needed. For pain     . RIZATRIPTAN BENZOATE 5 MG PO TABS Oral Take 5 mg by mouth as needed. For migraine. May repeat in 2 hours if needed       BP 127/85  Pulse 100  Temp(Src) 98.1 F (36.7 C) (Oral)  Resp 20  Ht 5' (1.524 m)  Wt 160 lb (72.576 kg)  BMI 31.25 kg/m2  SpO2 99%  Physical Exam  Nursing note and vitals reviewed. Constitutional: She appears well-developed and well-nourished.  HENT:  Head: Normocephalic and atraumatic.  Eyes: Conjunctivae are normal. Pupils are equal, round, and reactive to light.  Neck: Neck supple. No tracheal deviation present. No thyromegaly present.  Cardiovascular: Normal rate and regular rhythm.   No murmur heard. Pulmonary/Chest: Effort normal and breath sounds normal. No respiratory distress.       Coughing  Abdominal: Soft. Bowel sounds are normal. She exhibits no distension. There is no tenderness.       obese  Musculoskeletal: Normal range of motion. She exhibits no edema and no tenderness.  Neurological: She is alert. Coordination normal.  Skin: Skin is warm and dry. No rash noted.  Psychiatric: She has a normal mood and affect.    ED Course  Procedures (including critical care time) Coughing transiently improved after treatment with  albuterol neb Labs Reviewed - No data to display No results found.   No diagnosis found.    MDM  Plan prescription prednisone, Tussionex, albuterol HFA(patient requests refill) Followup Dr. Carolin Sicks if not improved in a week Diagnosis: Acute  bronchitis        Doug Sou, MD 10/06/11 1610

## 2011-10-06 MED ORDER — PREDNISONE 10 MG PO TABS: 20.0000 mg | ORAL_TABLET | Freq: Every day | ORAL | Status: DC

## 2011-10-06 MED ORDER — HYDROCOD POLST-CHLORPHEN POLST 10-8 MG/5ML PO LQCR: 5.0000 mL | Freq: Two times a day (BID) | ORAL | Status: DC | PRN

## 2011-10-06 MED ORDER — ALBUTEROL SULFATE HFA 108 (90 BASE) MCG/ACT IN AERS: 2.0000 | INHALATION_SPRAY | RESPIRATORY_TRACT | Status: DC | PRN

## 2011-10-17 ENCOUNTER — Ambulatory Visit (INDEPENDENT_AMBULATORY_CARE_PROVIDER_SITE_OTHER): Payer: BC Managed Care – PPO | Admitting: Family Medicine

## 2011-10-17 ENCOUNTER — Encounter: Payer: Self-pay | Admitting: Family Medicine

## 2011-10-17 VITALS — BP 130/70 | HR 97 | Temp 99.4°F | Ht 59.0 in | Wt 162.4 lb

## 2011-10-17 DIAGNOSIS — J40 Bronchitis, not specified as acute or chronic: Secondary | ICD-10-CM

## 2011-10-17 MED ORDER — AZITHROMYCIN 250 MG PO TABS: 250.0000 mg | ORAL_TABLET | Freq: Every day | ORAL | Status: AC

## 2011-10-17 MED ORDER — GUAIFENESIN-CODEINE 100-10 MG/5ML PO SYRP: 5.0000 mL | ORAL_SOLUTION | Freq: Three times a day (TID) | ORAL | Status: DC | PRN

## 2011-10-17 MED ORDER — BENZONATATE 200 MG PO CAPS: 200.0000 mg | ORAL_CAPSULE | Freq: Three times a day (TID) | ORAL | Status: AC | PRN

## 2011-10-17 MED ORDER — PREDNISONE 20 MG PO TABS: ORAL_TABLET | ORAL | Status: DC

## 2011-10-17 NOTE — Progress Notes (Signed)
  Subjective:    Patient ID: Michele Roberts, female    DOB: 17-Jul-1976, 36 y.o.   MRN: [id number]  HPI Bronchitis- went to ER on 12/26, was not given abx.  Started Prednisone, cough meds, and inhaler.  Cough has worsened.  Now w/ low grade temp.  Cough is dry but this AM had blood tinged sputum.  + nasal congestion.  No facial pain, ear pain.  + sick contacts.  + chest tightness.  Finished 5 day course of prednisone.   Review of Systems For ROS see HPI     Objective:   Physical Exam  Vitals reviewed. Constitutional: She appears well-developed and well-nourished. No distress.  HENT:  Head: Normocephalic and atraumatic.       TMs normal bilaterally Mild nasal congestion Throat w/out erythema, edema, or exudate  Eyes: Conjunctivae and EOM are normal. Pupils are equal, round, and reactive to light.  Neck: Normal range of motion. Neck supple.  Cardiovascular: Normal rate, regular rhythm, normal heart sounds and intact distal pulses.   No murmur heard. Pulmonary/Chest: Effort normal and breath sounds normal. No respiratory distress. She has no wheezes.       + hacking cough  Lymphadenopathy:    She has no cervical adenopathy.          Assessment & Plan:

## 2011-10-17 NOTE — Patient Instructions (Signed)
This is a bronchitis Take the Zpack as directed Use the cough pills for daytime, syrup for nights and weekends Add Mucinex to thin your congestion Start the prednisone- 2 tabs daily x10 days.  Take w/ food Drink plenty of fluids REST! Hang in there!!!

## 2011-10-19 ENCOUNTER — Telehealth: Payer: Self-pay | Admitting: *Deleted

## 2011-10-19 NOTE — Telephone Encounter (Signed)
Pt called concerned about continued symptoms. She is "about the same" w/no fever. I reinforced instructions from last OV and advised her it will take more time to feel better. She will call back w/any new/worsening symptoms.

## 2011-10-19 NOTE — Telephone Encounter (Signed)
It has only been 2 days since OV.  Needs to allow more time.

## 2011-10-25 NOTE — Assessment & Plan Note (Signed)
Due to duration of sxs and recent worsening will start abx.  Cough meds as needed.  Due to chest tightness will complete 10 day course of prednisone.  Reviewed supportive care and red flags that should prompt return.  Pt expressed understanding and is in agreement w/ plan.

## 2011-11-07 ENCOUNTER — Telehealth: Payer: Self-pay | Admitting: *Deleted

## 2011-11-07 ENCOUNTER — Ambulatory Visit (HOSPITAL_BASED_OUTPATIENT_CLINIC_OR_DEPARTMENT_OTHER)
Admission: RE | Admit: 2011-11-07 | Discharge: 2011-11-07 | Disposition: A | Discharge status: Home / Self Care | Payer: BC Managed Care – PPO | Source: Ambulatory Visit | Attending: Family Medicine | Admitting: Family Medicine

## 2011-11-07 DIAGNOSIS — R05 Cough: Secondary | ICD-10-CM

## 2011-11-07 DIAGNOSIS — R059 Cough, unspecified: Secondary | ICD-10-CM | POA: Insufficient documentation

## 2011-11-07 NOTE — Telephone Encounter (Signed)
Pt still c/o dry choking cough with some chest rattling. Pt notes that she has finished antibiotic but is still continuing to take the mucinex. .Please advise   [facility]

## 2011-11-07 NOTE — Telephone Encounter (Signed)
Discuss with patient  

## 2011-11-07 NOTE — Telephone Encounter (Signed)
Pt was seen early January- if still having severe cough will need CXR to determine next steps.

## 2011-11-08 ENCOUNTER — Ambulatory Visit (INDEPENDENT_AMBULATORY_CARE_PROVIDER_SITE_OTHER): Payer: BC Managed Care – PPO | Admitting: Internal Medicine

## 2011-11-08 ENCOUNTER — Telehealth: Payer: Self-pay | Admitting: *Deleted

## 2011-11-08 VITALS — BP 110/84 | HR 90 | Temp 98.9°F | Wt 164.0 lb

## 2011-11-08 DIAGNOSIS — J40 Bronchitis, not specified as acute or chronic: Secondary | ICD-10-CM

## 2011-11-08 MED ORDER — FLUCONAZOLE 150 MG PO TABS: ORAL_TABLET | ORAL | Status: AC

## 2011-11-08 MED ORDER — HYDROCOD POLST-CHLORPHEN POLST 10-8 MG/5ML PO LQCR: 5.0000 mL | Freq: Two times a day (BID) | ORAL | Status: DC | PRN

## 2011-11-08 MED ORDER — GUAIFENESIN-CODEINE 100-10 MG/5ML PO SYRP: 5.0000 mL | ORAL_SOLUTION | Freq: Three times a day (TID) | ORAL | Status: DC | PRN

## 2011-11-08 MED ORDER — BUDESONIDE-FORMOTEROL FUMARATE 160-4.5 MCG/ACT IN AERO: 2.0000 | INHALATION_SPRAY | Freq: Two times a day (BID) | RESPIRATORY_TRACT | Status: DC

## 2011-11-08 MED ORDER — AZITHROMYCIN 250 MG PO TABS: ORAL_TABLET | ORAL | Status: DC

## 2011-11-08 NOTE — Assessment & Plan Note (Addendum)
Having cough and perceived wheezing since 09-2011 CXR yesterday neg Has a h/o wheezing (although no formal dx of asthma) an uses albuterol prn particularly when exposed to dogs. Has a new pet ([place] pig) since 12-12. Impression: despite the lack of wheezing on exam, most likely Michele Roberts has asthma Plan: Avoid exposure to [place] pig! symbicort bid, Alb prn, prilosec, tussionex as needed, reasses in 2 weeks  Addendum: PCP already called a second  Z-Pak will go ahead and take the antibiotic

## 2011-11-08 NOTE — Progress Notes (Signed)
  Subjective:    Patient ID: Michele Roberts, female    DOB: [birth year], 36 y.o.   MRN: [id number]  HPI Acute visit, CC cough Chart reviewed, symptoms started by Christmas, went to the ER. Subsequently she was evaluated by PCP, diagnosed with bronchitis, prescribed a total of 10 days of prednisone. She temporarily improve after she saw PCP 10/25/2011 however after she finished a prednisone the symptoms came back: Cough, chest congestion, wheezing, shortness of breath, symptoms are worse at night. On further questioning, she acquired a [place] pig for her daughter 2 weeks before the onset of the symptoms    Past Medical History  Diagnosis Date  . Migraines   . GERD (gastroesophageal reflux disease)   . Migraines      Review of Systems Denies any fever or chills No sputum production. No GERD symptoms except with episodes of cough No lower extremity edema, chest pain only with cough.      Objective:   Physical Exam  Constitutional: She is oriented to person, place, and time. She appears well-developed and well-nourished.  HENT:  Head: Normocephalic and atraumatic.  Right Ear: External ear normal.  Left Ear: External ear normal.  Nose: Nose normal.  Neck: No JVD present.  Cardiovascular: Normal rate, regular rhythm and normal heart sounds.   No murmur heard. Pulmonary/Chest:       No rhonchi or crackles, slightly prolonged expiration without actual wheezing  Musculoskeletal: She exhibits no edema.  Neurological: She is alert and oriented to person, place, and time.      Assessment & Plan:

## 2011-11-08 NOTE — Patient Instructions (Signed)
prilosec OTC 20 mg: 1 before breakfast every day

## 2011-11-08 NOTE — Telephone Encounter (Signed)
.  rx faxed to pharmacy, manually for cough medication Robitussin Sent z-pack and diflucan to [facility]

## 2011-11-09 ENCOUNTER — Encounter: Payer: Self-pay | Admitting: Internal Medicine

## 2011-11-22 ENCOUNTER — Telehealth: Payer: Self-pay | Admitting: Family Medicine

## 2011-11-22 MED ORDER — RIZATRIPTAN BENZOATE 5 MG PO TABS: 5.0000 mg | ORAL_TABLET | ORAL | Status: DC | PRN

## 2011-11-22 NOTE — Telephone Encounter (Signed)
rx sent to pharmacy by e-script  

## 2011-11-22 NOTE — Telephone Encounter (Signed)
Refill- maxalt 5mg  tablet. Take one tablet by mouth as needed for migraine may repeat in 2 hours. Qty 12 last fill 1.14.12

## 2011-11-23 ENCOUNTER — Encounter: Payer: Self-pay | Admitting: Family Medicine

## 2011-11-23 ENCOUNTER — Ambulatory Visit (INDEPENDENT_AMBULATORY_CARE_PROVIDER_SITE_OTHER): Payer: BC Managed Care – PPO | Admitting: Family Medicine

## 2011-11-23 VITALS — BP 118/75 | HR 90 | Temp 98.8°F | Ht 59.0 in | Wt 164.6 lb

## 2011-11-23 DIAGNOSIS — R05 Cough: Secondary | ICD-10-CM

## 2011-11-23 MED ORDER — ALBUTEROL SULFATE HFA 108 (90 BASE) MCG/ACT IN AERS: 2.0000 | INHALATION_SPRAY | RESPIRATORY_TRACT | Status: DC | PRN

## 2011-11-23 MED ORDER — BUDESONIDE-FORMOTEROL FUMARATE 160-4.5 MCG/ACT IN AERO: 2.0000 | INHALATION_SPRAY | Freq: Two times a day (BID) | RESPIRATORY_TRACT | Status: DC

## 2011-11-23 MED ORDER — RABEPRAZOLE SODIUM 20 MG PO TBEC: 20.0000 mg | DELAYED_RELEASE_TABLET | Freq: Every day | ORAL | Status: DC

## 2011-11-23 MED ORDER — MOMETASONE FUROATE 50 MCG/ACT NA SUSP: 2.0000 | Freq: Every day | NASAL | Status: DC

## 2011-11-23 MED ORDER — HYDROCODONE-ACETAMINOPHEN 5-500 MG PO TABS: 1.0000 | ORAL_TABLET | Freq: Three times a day (TID) | ORAL | Status: AC | PRN

## 2011-11-23 MED ORDER — NAPROXEN 500 MG PO TABS: 500.0000 mg | ORAL_TABLET | Freq: Two times a day (BID) | ORAL | Status: DC

## 2011-11-23 NOTE — Progress Notes (Signed)
  Subjective:    Patient ID: Michele Roberts, female    DOB: 1976-03-31, 36 y.o.   MRN: [id number]  HPI Cough- reports sxs are improving but will still have episodes of 'gasping for air' w/ cough.  Is unable to laugh w/out coughing.  Friday night felt something 'crack' and now will have severe pain w/ cough under L breast- 'between my breast and my rib'.  No fevers.  Using Symbicort daily.  Using albuterol daily.  Minimal nasal congestion.  Recent worsening of GERD   Review of Systems For ROS see HPI     Objective:   Physical Exam  Vitals reviewed. Constitutional: She appears well-developed and well-nourished. No distress.  HENT:  Head: Normocephalic and atraumatic.  Right Ear: Tympanic membrane normal.  Left Ear: Tympanic membrane normal.  Nose: Mucosal edema and rhinorrhea present. Right sinus exhibits no maxillary sinus tenderness and no frontal sinus tenderness. Left sinus exhibits no maxillary sinus tenderness and no frontal sinus tenderness.  Mouth/Throat: Mucous membranes are normal. Posterior oropharyngeal erythema (w/ PND) present.  Eyes: Conjunctivae and EOM are normal. Pupils are equal, round, and reactive to light.  Neck: Normal range of motion. Neck supple.  Cardiovascular: Normal rate, regular rhythm, normal heart sounds and intact distal pulses.   No murmur heard. Pulmonary/Chest: Effort normal and breath sounds normal. No respiratory distress. She has no wheezes. She has no rales. She exhibits tenderness (over sternocostal jxn).       + hacking cough  Abdominal: Soft. Bowel sounds are normal. She exhibits no distension. There is no tenderness. There is no rebound and no guarding.  Lymphadenopathy:    She has no cervical adenopathy.          Assessment & Plan:

## 2011-11-23 NOTE — Patient Instructions (Signed)
The cough likely has many causes- acid reflux, post nasal drip, asthma Continue the Symbicort daily Use the albuterol as needed for shortness of breath/cough Continue the Zyrtec daily Add the nasonex- 2 sprays each nostril daily Start the aciphex daily to decrease reflux Take the Naproxen twice daily (w/ food) for the rib pain Use the vicodin at night for severe pain Ice or heat as needed Call with any questions or concerns Hang in there!

## 2011-11-24 ENCOUNTER — Telehealth: Payer: Self-pay | Admitting: Family Medicine

## 2011-11-24 ENCOUNTER — Ambulatory Visit (HOSPITAL_BASED_OUTPATIENT_CLINIC_OR_DEPARTMENT_OTHER)
Admission: RE | Admit: 2011-11-24 | Discharge: 2011-11-24 | Disposition: A | Discharge status: Home / Self Care | Payer: BC Managed Care – PPO | Source: Ambulatory Visit | Attending: Family Medicine | Admitting: Family Medicine

## 2011-11-24 DIAGNOSIS — R059 Cough, unspecified: Secondary | ICD-10-CM | POA: Insufficient documentation

## 2011-11-24 DIAGNOSIS — R05 Cough: Secondary | ICD-10-CM

## 2011-11-24 DIAGNOSIS — R0602 Shortness of breath: Secondary | ICD-10-CM | POA: Insufficient documentation

## 2011-11-24 DIAGNOSIS — R079 Chest pain, unspecified: Secondary | ICD-10-CM

## 2011-11-24 MED ORDER — GUAIFENESIN-CODEINE 100-10 MG/5ML PO SYRP: 10.0000 mL | ORAL_SOLUTION | Freq: Three times a day (TID) | ORAL | Status: AC | PRN

## 2011-11-24 NOTE — Telephone Encounter (Signed)
Pt seen on yesterday Pt states that she was at work and she had a bad coughing spell and heard something pop. Pt now c/o increase pain with ambulation,and tenderness to touch. Pt notes that she has taking tylenol with no relief today. Pt indicated that she has not taking the Rx for naprosyn today because she left it at home but when she took it on last night she still had to use heating pad quite frequently to get some relief. Pt advise will speak with provider but if she starts to experience increase pain, swelling or tenderness along with difficulty to breath she will need the be seen in ED. Pt ok and verbalized understanding.

## 2011-11-24 NOTE — Telephone Encounter (Signed)
Once xray available will determine next steps.

## 2011-11-24 NOTE — Telephone Encounter (Signed)
Patient states that she was seen yesterday for a pulled muscle. She states that she is not feeling better, pain is getting worse. Please assist.

## 2011-11-24 NOTE — Telephone Encounter (Signed)
Per Dr Beverely Low send Pt to have chest x-ray then will proceed further once we have results. Pt ok info will go the have x-ray done now.

## 2011-12-06 DIAGNOSIS — R05 Cough: Secondary | ICD-10-CM | POA: Insufficient documentation

## 2011-12-06 NOTE — Assessment & Plan Note (Signed)
New.  Likely multifactorial- PND, asthma, GERD.  Start Aciphex for GERD.  Continue Symbicort and albuterol for asthma.  Add nasal steroid and continue OTC antihistamine.  Start pain meds for costochondritis.  Reviewed supportive care and red flags that should prompt return.  Pt expressed understanding and is in agreement w/ plan.

## 2012-01-23 ENCOUNTER — Ambulatory Visit (INDEPENDENT_AMBULATORY_CARE_PROVIDER_SITE_OTHER): Payer: BC Managed Care – PPO | Admitting: Internal Medicine

## 2012-01-23 ENCOUNTER — Encounter: Payer: Self-pay | Admitting: Internal Medicine

## 2012-01-23 VITALS — BP 120/82 | HR 100 | Temp 98.4°F | Wt 164.0 lb

## 2012-01-23 DIAGNOSIS — F439 Reaction to severe stress, unspecified: Secondary | ICD-10-CM

## 2012-01-23 DIAGNOSIS — Z733 Stress, not elsewhere classified: Secondary | ICD-10-CM

## 2012-01-23 DIAGNOSIS — R51 Headache: Secondary | ICD-10-CM

## 2012-01-23 MED ORDER — CITALOPRAM HYDROBROMIDE 20 MG PO TABS: 20.0000 mg | ORAL_TABLET | Freq: Every day | ORAL | Status: DC

## 2012-01-23 NOTE — Patient Instructions (Signed)
Please  schedule followup in 2 weeks with Dr Beverely Low

## 2012-01-23 NOTE — Progress Notes (Signed)
  Subjective:    Patient ID: Michele Roberts, female    DOB: 12/19/1975, 36 y.o.   MRN: [id number]  HPI As of 10:00 last night she began to have a sharp  headache over the crown but with diffuse distribution over her head to involve the upper back. This responded suboptimally to the Maxalt which normally helps her migraines. She feels that this is stress related due to a separation (husband having an affair)  & child care duties (17, 38, & 7).  She denies a definite prodrome or aura; but she did have anxiety prior to the onset of her headache. She has tried to treat her headaches with Excedrin Migraine.  She noticed self-limited heaviness in the right upper extremity and some tingling in  left face.    Review of Systems   She denies extremity weakness, numbness and tingling, or incontinence of urine stool.  She described some panic type symptoms last night with uncontrollable crying & inability to sleep     Objective:   Physical Exam She appears healthy and well-nourished and in no acute distress  She has no lymphadenopathy the head, neck, axilla.  Cranial nerve exam was unremarkable with no neurologic deficit.  She has a click-like S4 with a regular rhythm.  Deep tendon reflexes, tone and strength are all normal.  Gait, Romberg testing and finger to nose testing are normal  She is alert and oriented her affect is slightly flat.        Assessment & Plan:  #1 headache, atypical. No neuromuscular deficit present. Pathophysiology of neurotransmitter deficiency & relationship to headaches discussed.  #2 severe situational stress Plan: See orders and recommendations

## 2012-02-08 ENCOUNTER — Ambulatory Visit: Payer: BC Managed Care – PPO | Admitting: Family Medicine

## 2012-02-16 ENCOUNTER — Ambulatory Visit: Payer: BC Managed Care – PPO | Admitting: Family Medicine

## 2012-02-17 ENCOUNTER — Ambulatory Visit: Payer: BC Managed Care – PPO | Admitting: Family Medicine

## 2012-02-17 DIAGNOSIS — Z0289 Encounter for other administrative examinations: Secondary | ICD-10-CM

## 2012-02-24 ENCOUNTER — Encounter: Payer: Self-pay | Admitting: Family Medicine

## 2012-02-24 ENCOUNTER — Telehealth: Payer: Self-pay | Admitting: Family Medicine

## 2012-02-24 NOTE — Telephone Encounter (Signed)
FYI for your review  

## 2012-02-24 NOTE — Telephone Encounter (Signed)
Patient walked in at 9:54am to scheduler [facility], stating she was told she had an appointment 5.17.13 at 9:45am. I reviewed patient appointment desk saw no such appointment. I reviewed past appointments from appointment desk & explained to patient that her appointment was for 5.10.13 at 9:45am, which is marked as a NO SHOW. Patient stated she was called to reschedule her appointment by a scheduler. Original appointment was 5.1.13, patient cancelled 4.22.13 at 2:27pm due to another appointment she had . She was rescheduled on 5.9.13, and patient once again cancelled follow up appointment 5.8.13 at 4:03pm, due to a work meeting. Appointment again was rescheduled 5.7.13 for 5.10.13 at 9:45am, for which patient was a no show. Upon patients arrival today, provider was approached with situation and offered a 2:30 and/or 2:45 appointment for 5.17.13. This was offered to patient and she refused, I apologized to the patient several times and offered her the appointments again as well as a 3:45pm with Dr.Paz for which she again stated she would not due because she took the morning off to come see Dr.tabori. I offered to reschedule her in June for any day of her choice at any time that would accommodate her schedule and she stated her June calendar was full, her daughter was graduating and going to college. She then asked to speak to [employer], I went over issues with Janna Arch, she immediately started working with Provider here as well as the H.P. Office. In my presence she apologized to the patient several times offered every available option for patient to see a provider today and patient refused. 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

## 2012-03-07 ENCOUNTER — Telehealth: Payer: Self-pay | Admitting: Family Medicine

## 2012-03-07 NOTE — Telephone Encounter (Signed)
Janna Arch reviewed, forwarded to [employer]

## 2012-08-20 ENCOUNTER — Emergency Department (HOSPITAL_COMMUNITY)
Admission: EM | Admit: 2012-08-20 | Discharge: 2012-08-20 | Disposition: A | Discharge status: Home / Self Care | Payer: BC Managed Care – PPO | Attending: Emergency Medicine | Admitting: Emergency Medicine

## 2012-08-20 ENCOUNTER — Emergency Department (HOSPITAL_COMMUNITY): Payer: BC Managed Care – PPO

## 2012-08-20 ENCOUNTER — Encounter (HOSPITAL_COMMUNITY): Payer: Self-pay | Admitting: Emergency Medicine

## 2012-08-20 DIAGNOSIS — T50901A Poisoning by unspecified drugs, medicaments and biological substances, accidental (unintentional), initial encounter: Secondary | ICD-10-CM | POA: Insufficient documentation

## 2012-08-20 DIAGNOSIS — G43909 Migraine, unspecified, not intractable, without status migrainosus: Secondary | ICD-10-CM | POA: Insufficient documentation

## 2012-08-20 DIAGNOSIS — T50995A Adverse effect of other drugs, medicaments and biological substances, initial encounter: Secondary | ICD-10-CM | POA: Insufficient documentation

## 2012-08-20 DIAGNOSIS — T402X1A Poisoning by other opioids, accidental (unintentional), initial encounter: Secondary | ICD-10-CM

## 2012-08-20 DIAGNOSIS — K219 Gastro-esophageal reflux disease without esophagitis: Secondary | ICD-10-CM | POA: Insufficient documentation

## 2012-08-20 DIAGNOSIS — R4182 Altered mental status, unspecified: Secondary | ICD-10-CM | POA: Insufficient documentation

## 2012-08-20 DIAGNOSIS — J40 Bronchitis, not specified as acute or chronic: Secondary | ICD-10-CM | POA: Insufficient documentation

## 2012-08-20 DIAGNOSIS — Z79899 Other long term (current) drug therapy: Secondary | ICD-10-CM | POA: Insufficient documentation

## 2012-08-20 DIAGNOSIS — J209 Acute bronchitis, unspecified: Secondary | ICD-10-CM

## 2012-08-20 LAB — COMPREHENSIVE METABOLIC PANEL
AST: 14 U/L (ref 0–37)
Albumin: 3.1 g/dL — ABNORMAL LOW (ref 3.5–5.2)
Chloride: 105 mEq/L (ref 96–112)
Creatinine, Ser: 0.61 mg/dL (ref 0.50–1.10)
Potassium: 3.5 mEq/L (ref 3.5–5.1)
Total Bilirubin: 0.5 mg/dL (ref 0.3–1.2)
Total Protein: 6.1 g/dL (ref 6.0–8.3)

## 2012-08-20 LAB — CBC
MCV: 84.3 fL (ref 78.0–100.0)
Platelets: 287 10*3/uL (ref 150–400)
RDW: 12.8 % (ref 11.5–15.5)
WBC: 10.2 10*3/uL (ref 4.0–10.5)

## 2012-08-20 LAB — URINALYSIS, ROUTINE W REFLEX MICROSCOPIC
Nitrite: NEGATIVE
Specific Gravity, Urine: 1.029 (ref 1.005–1.030)
Urobilinogen, UA: 0.2 mg/dL (ref 0.0–1.0)

## 2012-08-20 LAB — RAPID URINE DRUG SCREEN, HOSP PERFORMED
Barbiturates: NOT DETECTED
Tetrahydrocannabinol: NOT DETECTED

## 2012-08-20 LAB — PREGNANCY, URINE: Preg Test, Ur: NEGATIVE

## 2012-08-20 MED ORDER — PREDNISONE 20 MG PO TABS: 40.0000 mg | ORAL_TABLET | Freq: Every day | ORAL | Status: DC

## 2012-08-20 MED ORDER — BENZONATATE 100 MG PO CAPS: 200.0000 mg | ORAL_CAPSULE | Freq: Two times a day (BID) | ORAL | Status: DC | PRN

## 2012-08-20 MED ORDER — ALBUTEROL SULFATE HFA 108 (90 BASE) MCG/ACT IN AERS: 2.0000 | INHALATION_SPRAY | RESPIRATORY_TRACT | Status: DC | PRN

## 2012-08-20 NOTE — ED Notes (Addendum)
Pt arrived by ems. Pt children found pt unresponsive tonight. empty Cheratussin bottle found at beside last filled in feb. Also empty oxycodone bottle last filled in jan. (name on bottle not pt name.) pt is very drowsy but responds to speech and is orientedx4. Pt states she only took cough medication. Pt does have a cough. Vs wnl. Pt resting in bed. cbg 115. Pt reports going to bed at 5pm tonight

## 2012-08-20 NOTE — ED Notes (Signed)
Pt reports that she feels like she is "shakey" and having a hard time catching her breath. Stats 98%RA

## 2012-08-20 NOTE — ED Notes (Addendum)
Pt. Called out to RN reporting she felt like she was wheezing. Lung sounds clear. o2 stats 100% RA. Pt appears diaphoretic. Pt given cold rag to place on forehead.

## 2012-08-20 NOTE — ED Notes (Signed)
Pt reports to RN that there is a lot going on at home. Her daughter just went away to college and she misses her, she and her husband are separated and she just says there is a lot going on. Doesn't go into much detail. Denies SI, states that she just wanted to get some sleep and that she doesn't sleep well at night. Unsure of amount of cough med. She took.

## 2012-08-20 NOTE — ED Provider Notes (Addendum)
History     CSN: [id number]  Arrival date & time 08/20/12  0001   First MD Initiated Contact with Patient 08/20/12 0110      Chief Complaint  Patient presents with  . Altered Mental Status    (Consider location/radiation/quality/duration/timing/severity/associated sxs/prior treatment) HPI Comments: Ms. Clearance Coots presents via EMS with her grade school-aged daughters.  She is reported to have stated she did not feel well this evening.  She prepared dinner and ate with the children then she went to bed.  The girls state they were concerned because they could not get their mother to wake up.  They called their father and he instructed them to call 911.  Ms.  Clearance Coots states, "I feel fine."  She is somnolent but is able to recognize her children and her ex husband.  She is unable to contribute much to her history.  EMS found a bottle of cheratussin and an empty bottle of oxycodone at the bedside.  No hemodynamic instability was reported en route to the ER.  She is reported by family to have had a cough for several days and has previously had bronchitis.  Patient is a 36 y.o. female presenting with altered mental status. The history is provided by the patient and a relative (her daughters and their father). The history is limited by the condition of the patient (pt is altered and somnolent).  Altered Mental Status This is a new problem. The current episode started 3 to 5 hours ago. The problem occurs constantly. The problem has not changed since onset.Pertinent negatives include no chest pain, no abdominal pain, no headaches and no shortness of breath. Associated symptoms comments: Cough . Nothing aggravates the symptoms. Nothing relieves the symptoms.    Past Medical History  Diagnosis Date  . Migraines   . GERD (gastroesophageal reflux disease)   . Migraines     Past Surgical History  Procedure Date  . Appendectomy   . Cesarean section     Family History  Problem Relation Age of Onset    . Stroke Maternal Grandmother   . Hypertension Neg Hx   . Diabetes Neg Hx   . Cancer Neg Hx     History  Substance Use Topics  . Smoking status: Never Smoker   . Smokeless tobacco: Not on file  . Alcohol Use: No    OB History    Grav Para Term Preterm Abortions TAB SAB Ect Mult Living                  Review of Systems  Unable to perform ROS Respiratory: Negative for shortness of breath.   Cardiovascular: Negative for chest pain.  Gastrointestinal: Negative for abdominal pain.  Neurological: Negative for headaches.  Psychiatric/Behavioral: Positive for altered mental status.    Allergies  Amoxicillin; Banana; and Pineapple  Home Medications   Current Outpatient Rx  Name  Route  Sig  Dispense  Refill  . ALBUTEROL SULFATE HFA 108 (90 BASE) MCG/ACT IN AERS   Inhalation   Inhale 2 puffs into the lungs every 4 (four) hours as needed for wheezing or shortness of breath (or cough).   1 Inhaler   3   . BUDESONIDE-FORMOTEROL FUMARATE 160-4.5 MCG/ACT IN AERO   Inhalation   Inhale 2 puffs into the lungs 2 (two) times daily.   1 Inhaler   3   . CETIRIZINE HCL 10 MG PO TABS   Oral   Take 10 mg by mouth daily.           Marland Kitchen  HYDROCOD POLST-CPM POLST ER 10-8 MG/5ML PO LQCR   Oral   Take 5 mLs by mouth every 12 (twelve) hours as needed.   115 mL   0   . MOMETASONE FUROATE 50 MCG/ACT NA SUSP   Nasal   Place 2 sprays into the nose daily.   17 g   12   . RIZATRIPTAN BENZOATE 5 MG PO TABS   Oral   Take 1 tablet (5 mg total) by mouth as needed. For migraine. May repeat in 2 hours if needed   10 tablet   3     BP 119/81  Pulse 85  Temp 98.9 F (37.2 C) (Oral)  Resp 10  SpO2 98%  Physical Exam  Nursing note and vitals reviewed. Constitutional: She appears well-developed and well-nourished. She appears lethargic. She is sleeping.  Non-toxic appearance. She does not have a sickly appearance. She does not appear ill. No distress.  HENT:  Head: Normocephalic  and atraumatic. Head is without raccoon's eyes, without Battle's sign, without right periorbital erythema and without left periorbital erythema. No trismus in the jaw.  Right Ear: Hearing, tympanic membrane, external ear and ear canal normal. No mastoid tenderness. No middle ear effusion. No hemotympanum.  Left Ear: Hearing, tympanic membrane, external ear and ear canal normal.  No middle ear effusion.  Nose: Nose normal. No mucosal edema or rhinorrhea. Right sinus exhibits no maxillary sinus tenderness and no frontal sinus tenderness. Left sinus exhibits no maxillary sinus tenderness and no frontal sinus tenderness.  Mouth/Throat: Uvula is midline, oropharynx is clear and moist and mucous membranes are normal. Mucous membranes are not pale, not dry and not cyanotic. No uvula swelling. No oropharyngeal exudate, posterior oropharyngeal edema, posterior oropharyngeal erythema or tonsillar abscesses.  Eyes: Conjunctivae normal are normal. Pupils are equal, round, and reactive to light. Right eye exhibits no discharge. Left eye exhibits no discharge. No scleral icterus.  Neck: Normal range of motion. Neck supple. No JVD present. No tracheal deviation present. No thyromegaly present.  Cardiovascular: Normal rate, regular rhythm, normal heart sounds and intact distal pulses.  Exam reveals no gallop and no friction rub.   No murmur heard. Pulmonary/Chest: Effort normal and breath sounds normal. No stridor. No respiratory distress. She has no wheezes. She has no rales. She exhibits no tenderness.  Abdominal: Soft. Bowel sounds are normal. She exhibits no distension and no mass. There is no tenderness. There is no rebound and no guarding.  Musculoskeletal: Normal range of motion. She exhibits no edema and no tenderness.  Lymphadenopathy:    She has no cervical adenopathy.  Neurological: She has normal strength. She appears lethargic. She displays no atrophy and no tremor. No cranial nerve deficit or sensory  deficit. She exhibits normal muscle tone. She displays no seizure activity. GCS eye subscore is 4. GCS verbal subscore is 4. GCS motor subscore is 6. She displays no Babinski's sign on the right side. She displays no Babinski's sign on the left side.  Skin: Skin is warm, dry and intact. No rash noted. She is not diaphoretic. No cyanosis or erythema. No pallor. Nails show no clubbing.    ED Course  Procedures (including critical care time)   Labs Reviewed  URINE RAPID DRUG SCREEN (HOSP PERFORMED)  URINALYSIS, ROUTINE W REFLEX MICROSCOPIC  PREGNANCY, URINE  CBC  COMPREHENSIVE METABOLIC PANEL  ETHANOL   No results found.   No diagnosis found.    MDM  Pt presents for evaluation of altered mental status.  She  is somnolent.  She had been taking a cough suppressant (cheratussin - guaifenesin + codeine) and there is also a bottle of oxycodone at the bedside.  She is currently somnolent but arousable with stable vital signs.  Will obtain basic labs, a CXR, and tox panel.  Suspect a narcotic overdose.  Will perform serial exams.  Note constricted pupils.  She has no facial asymmetry or unilateral weakness.  She has no meningismus.  1610.  Pt stable, NAD.  Her mental status has been slowly improving.  She is now oriented and reports taking a large amount of the cough syrup.  She has reported some abdominal cramping.  Note nl LFTs.  Note essentially unremarkable labs.  The urine drug screen is positive for opiates.  Her children have been brought to a relative's house.  She will be discharged home shortly.  She has a nonproductive cough and clear lung sounds.  There is no infiltrate on CXR.  By hx, it appears she may have a bronchitis.  Will treat symptoms.  She denies thoughts of suicide or intention to harm herself.  She reports multiple stressors.      Tobin Chad, MD 08/20/12 9604  Tobin Chad, MD 08/20/12 [phone]

## 2012-08-20 NOTE — ED Notes (Signed)
Pt placed in modified trendelenburg. bp now 110/ 67

## 2012-08-20 NOTE — ED Notes (Signed)
Pt to xray

## 2012-08-24 ENCOUNTER — Encounter: Payer: Self-pay | Admitting: Family Medicine

## 2012-08-24 ENCOUNTER — Ambulatory Visit (INDEPENDENT_AMBULATORY_CARE_PROVIDER_SITE_OTHER): Payer: BC Managed Care – PPO | Admitting: Family Medicine

## 2012-08-24 VITALS — BP 102/78 | HR 89 | Temp 98.6°F | Wt 160.0 lb

## 2012-08-24 DIAGNOSIS — J019 Acute sinusitis, unspecified: Secondary | ICD-10-CM | POA: Insufficient documentation

## 2012-08-24 MED ORDER — FLUCONAZOLE 150 MG PO TABS: 150.0000 mg | ORAL_TABLET | Freq: Once | ORAL | Status: DC

## 2012-08-24 MED ORDER — CLARITHROMYCIN ER 500 MG PO TB24: 1000.0000 mg | ORAL_TABLET | Freq: Every day | ORAL | Status: DC

## 2012-08-24 NOTE — Progress Notes (Signed)
  Subjective:    Patient ID: Michele Roberts, female    DOB: 1976/05/08, 36 y.o.   MRN: [id number]  HPI ER follow up- went to ER on 11/10 via EMS b/c daughters found her unresponsive at home after taking Tussionex.  Normal CXR.  Was not given abx but started on Prednisone and tessalon.  Has been coughing x2 weeks.  Cough is not productive.  No known fevers.  + maxillary pressure and ear fullness.  Initially had sore throat but this improved.  + sick contacts- works in pediatric office.   Review of Systems For ROS see HPI     Objective:   Physical Exam  Vitals reviewed. Constitutional: She appears well-developed and well-nourished. No distress.  HENT:  Head: Normocephalic and atraumatic.  Right Ear: Tympanic membrane normal.  Left Ear: Tympanic membrane normal.  Nose: Mucosal edema and rhinorrhea present. Right sinus exhibits maxillary sinus tenderness and frontal sinus tenderness. Left sinus exhibits maxillary sinus tenderness and frontal sinus tenderness.  Mouth/Throat: Uvula is midline and mucous membranes are normal. Posterior oropharyngeal erythema present. No oropharyngeal exudate.  Eyes: Conjunctivae normal and EOM are normal. Pupils are equal, round, and reactive to light.  Neck: Normal range of motion. Neck supple.  Cardiovascular: Normal rate, regular rhythm and normal heart sounds.   Pulmonary/Chest: Effort normal and breath sounds normal. No respiratory distress. She has no wheezes.  Lymphadenopathy:    She has no cervical adenopathy.          Assessment & Plan:

## 2012-08-24 NOTE — Patient Instructions (Addendum)
This appears to be a sinus infection Start the Biaxin- 2 tabs at the same time daily x10 days Add Mucinex to thin your congestion Delsym as needed for cough REST! Hang in there!!! Happy [birth year] Birthday!!

## 2012-08-24 NOTE — Assessment & Plan Note (Signed)
New.  Pt's duration of sxs and PE consistent w/ infxn.  Start abx.  No narcotic cough syrup as pt has recently inadvertently overdosed on this.  Reviewed supportive care and red flags that should prompt return.  Pt expressed understanding and is in agreement w/ plan.

## 2012-09-26 ENCOUNTER — Encounter: Payer: Self-pay | Admitting: Family Medicine

## 2012-09-26 ENCOUNTER — Ambulatory Visit (INDEPENDENT_AMBULATORY_CARE_PROVIDER_SITE_OTHER): Payer: BC Managed Care – PPO | Admitting: Family Medicine

## 2012-09-26 VITALS — BP 108/80 | HR 82 | Temp 98.6°F | Ht 58.25 in | Wt 159.0 lb

## 2012-09-26 DIAGNOSIS — Z9189 Other specified personal risk factors, not elsewhere classified: Secondary | ICD-10-CM

## 2012-09-26 DIAGNOSIS — Z202 Contact with and (suspected) exposure to infections with a predominantly sexual mode of transmission: Secondary | ICD-10-CM | POA: Insufficient documentation

## 2012-09-26 DIAGNOSIS — R300 Dysuria: Secondary | ICD-10-CM | POA: Insufficient documentation

## 2012-09-26 DIAGNOSIS — Z0000 Encounter for general adult medical examination without abnormal findings: Secondary | ICD-10-CM

## 2012-09-26 LAB — CBC WITH DIFFERENTIAL/PLATELET
Basophils Absolute: 0 10*3/uL (ref 0.0–0.1)
Eosinophils Relative: 2.2 % (ref 0.0–5.0)
HCT: 37.3 % (ref 36.0–46.0)
Lymphocytes Relative: 20 % (ref 12.0–46.0)
Monocytes Relative: 5.9 % (ref 3.0–12.0)
Neutrophils Relative %: 71.5 % (ref 43.0–77.0)
Platelets: 349 10*3/uL (ref 150.0–400.0)
WBC: 8.8 10*3/uL (ref 4.5–10.5)

## 2012-09-26 LAB — BASIC METABOLIC PANEL
CO2: 24 mEq/L (ref 19–32)
Chloride: 106 mEq/L (ref 96–112)
Glucose, Bld: 99 mg/dL (ref 70–99)
Potassium: 3.9 mEq/L (ref 3.5–5.1)
Sodium: 136 mEq/L (ref 135–145)

## 2012-09-26 LAB — POCT URINALYSIS DIPSTICK
Ketones, UA: NEGATIVE
Nitrite, UA: NEGATIVE
Protein, UA: NEGATIVE
Urobilinogen, UA: 0.2

## 2012-09-26 LAB — HEPATIC FUNCTION PANEL
AST: 16 U/L (ref 0–37)
Albumin: 3.8 g/dL (ref 3.5–5.2)
Alkaline Phosphatase: 54 U/L (ref 39–117)
Bilirubin, Direct: 0 mg/dL (ref 0.0–0.3)
Total Protein: 7.4 g/dL (ref 6.0–8.3)

## 2012-09-26 LAB — LIPID PANEL
LDL Cholesterol: 115 mg/dL — ABNORMAL HIGH (ref 0–99)
Total CHOL/HDL Ratio: 4

## 2012-09-26 MED ORDER — CIPROFLOXACIN HCL 500 MG PO TABS: 500.0000 mg | ORAL_TABLET | Freq: Two times a day (BID) | ORAL | Status: DC

## 2012-09-26 NOTE — Assessment & Plan Note (Signed)
New.  Due to boyfriend's cheating will check pt for STDs.  Pt expressed understanding and is in agreement w/ plan.

## 2012-09-26 NOTE — Progress Notes (Signed)
  Subjective:    Patient ID: Michele Roberts, female    DOB: [birth year], 36 y.o.   MRN: [id number]  HPI CPE- UTD on GYN.  Dysuria- sxs started 3 days ago, increased frequency, urgency, suprapubic pressure.  No fevers/back pain.   Review of Systems Patient reports no vision/ hearing changes, adenopathy,fever, weight change,  persistant/recurrent hoarseness , swallowing issues, chest pain, palpitations, edema, persistant/recurrent cough, hemoptysis, dyspnea (rest/exertional/paroxysmal nocturnal), gastrointestinal bleeding (melena, rectal bleeding), bowel changes, Gyn symptoms (abnormal  bleeding, pain),  syncope, focal weakness, memory loss, numbness & tingling, skin/hair/nail changes, abnormal bruising or bleeding, anxiety, or depression.   + GERD, abd pain/bloating Boyfriend was cheating- concerns for STDs    Objective:   Physical Exam General Appearance:    Alert, cooperative, no distress, appears stated age  Head:    Normocephalic, without obvious abnormality, atraumatic  Eyes:    PERRL, conjunctiva/corneas clear, EOM's intact, fundi    benign, both eyes  Ears:    Normal TM's and external ear canals, both ears  Nose:   Nares normal, septum midline, mucosa normal, no drainage    or sinus tenderness  Throat:   Lips, mucosa, and tongue normal; teeth and gums normal  Neck:   Supple, symmetrical, trachea midline, no adenopathy;    Thyroid: no enlargement/tenderness/nodules  Back:     Symmetric, no curvature, ROM normal, no CVA tenderness  Lungs:     Clear to auscultation bilaterally, respirations unlabored  Chest Wall:    No tenderness or deformity   Heart:    Regular rate and rhythm, S1 and S2 normal, no murmur, rub   or gallop  Breast Exam:    Deferred to GYN  Abdomen:     Soft, non-tender, bowel sounds active all four quadrants,    no masses, no organomegaly  Genitalia:    Deferred to GYN  Rectal:    Extremities:   Extremities normal, atraumatic, no cyanosis or edema  Pulses:    2+ and symmetric all extremities  Skin:   Skin color, texture, turgor normal, no rashes or lesions  Lymph nodes:   Cervical, supraclavicular, and axillary nodes normal  Neurologic:   CNII-XII intact, normal strength, sensation and reflexes    throughout          Assessment & Plan:

## 2012-09-26 NOTE — Patient Instructions (Addendum)
Follow up in 1 year or as needed Keep up the good work!  You look great! We'll notify you of your lab results and make any changes if needed Ibuprofen and heating pad for tension headache/muscle spasm Call with any questions or concerns Happy Holidays!!!

## 2012-09-26 NOTE — Assessment & Plan Note (Signed)
New.  Pt's sxs and UA consistent w/ infxn.  Start abx.  Reviewed supportive care and red flags that should prompt return.  Pt expressed understanding and is in agreement w/ plan.  

## 2012-09-26 NOTE — Assessment & Plan Note (Signed)
Pt's PE WNL.  UTD on GYN.  Check labs.  Anticipatory guidance provided.  

## 2012-09-27 LAB — HIV ANTIBODY (ROUTINE TESTING W REFLEX): HIV: NONREACTIVE

## 2012-09-28 LAB — URINE CULTURE: Colony Count: NO GROWTH

## 2012-09-28 LAB — GC/CHLAMYDIA PROBE AMP, URINE: Chlamydia, Swab/Urine, PCR: NEGATIVE

## 2012-09-29 LAB — VITAMIN D 1,25 DIHYDROXY
Vitamin D 1, 25 (OH)2 Total: 43 pg/mL (ref 18–72)
Vitamin D2 1, 25 (OH)2: <8 pg/mL

## 2012-11-15 ENCOUNTER — Other Ambulatory Visit: Payer: Self-pay | Admitting: *Deleted

## 2012-11-15 NOTE — Telephone Encounter (Signed)
Ok for Epipen 2 pack w/ 3 refills

## 2012-11-15 NOTE — Telephone Encounter (Signed)
Pt left VM that her epi-pen has expired and would like to get another Rx filled for this. .Please advise

## 2012-11-16 MED ORDER — EPINEPHRINE 0.3 MG/0.3ML IJ DEVI: 0.3000 mg | Freq: Once | INTRAMUSCULAR | Status: DC

## 2012-11-16 NOTE — Telephone Encounter (Signed)
Rx sent to the pharmacy by e-script.//AB/CMA 
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
## 2013-01-16 ENCOUNTER — Telehealth: Payer: Self-pay | Admitting: Family Medicine

## 2013-01-16 NOTE — Telephone Encounter (Signed)
Refill: symbicort and maxalt. No other info listed.

## 2013-01-17 MED ORDER — RIZATRIPTAN BENZOATE 5 MG PO TABS: 5.0000 mg | ORAL_TABLET | ORAL | Status: DC | PRN

## 2013-01-17 MED ORDER — BUDESONIDE-FORMOTEROL FUMARATE 160-4.5 MCG/ACT IN AERO: 2.0000 | INHALATION_SPRAY | Freq: Two times a day (BID) | RESPIRATORY_TRACT | Status: DC

## 2013-01-17 NOTE — Telephone Encounter (Signed)
Rx sent to the pharmacy by e-script.//AB/CMA 

## 2013-06-25 ENCOUNTER — Encounter: Payer: Self-pay | Admitting: Family Medicine

## 2013-06-25 ENCOUNTER — Ambulatory Visit (INDEPENDENT_AMBULATORY_CARE_PROVIDER_SITE_OTHER): Payer: BC Managed Care – PPO | Admitting: Family Medicine

## 2013-06-25 VITALS — BP 116/80 | HR 76 | Temp 98.7°F | Wt 166.4 lb

## 2013-06-25 DIAGNOSIS — J019 Acute sinusitis, unspecified: Secondary | ICD-10-CM

## 2013-06-25 MED ORDER — FLUCONAZOLE 150 MG PO TABS: 150.0000 mg | ORAL_TABLET | Freq: Once | ORAL | Status: DC

## 2013-06-25 MED ORDER — SULFAMETHOXAZOLE-TMP DS 800-160 MG PO TABS: 1.0000 | ORAL_TABLET | Freq: Two times a day (BID) | ORAL | Status: DC

## 2013-06-25 NOTE — Progress Notes (Signed)
  Subjective:    Patient ID: Michele Roberts, female    DOB: 08/20/1976, 37 y.o.   MRN: [id number]  HPI URI- sxs started yesterday.  + nasal congestion, facial pain, tooth pain.  + chills but no documented fever.  + sore throat, nausea.  No cough.  + ear pain- L>R.  + sick contacts.     Review of Systems For ROS see HPI     Objective:   Physical Exam  Vitals reviewed. Constitutional: She appears well-developed and well-nourished. No distress.  HENT:  Head: Normocephalic and atraumatic.  Right Ear: Tympanic membrane normal.  Left Ear: Tympanic membrane normal.  Nose: Mucosal edema and rhinorrhea present. Right sinus exhibits maxillary sinus tenderness and frontal sinus tenderness. Left sinus exhibits maxillary sinus tenderness and frontal sinus tenderness.  Mouth/Throat: Uvula is midline and mucous membranes are normal. Posterior oropharyngeal erythema present. No oropharyngeal exudate.  Eyes: Conjunctivae and EOM are normal. Pupils are equal, round, and reactive to light.  Neck: Normal range of motion. Neck supple.  Cardiovascular: Normal rate, regular rhythm and normal heart sounds.   Pulmonary/Chest: Effort normal and breath sounds normal. No respiratory distress. She has no wheezes.  Lymphadenopathy:    She has no cervical adenopathy.          Assessment & Plan:

## 2013-06-25 NOTE — Addendum Note (Signed)
Addended by: Sheliah Hatch on: 06/25/2013 11:57 AM   Modules accepted: Orders

## 2013-06-25 NOTE — Patient Instructions (Addendum)
This is a sinus infection Start the Bactrim twice daily- take w/ food Drink plenty of fluids REST! Hang in there!!

## 2013-06-25 NOTE — Assessment & Plan Note (Signed)
Pt's sxs and PE consistent w/ infxn.  Start abx.  Reviewed supportive care and red flags that should prompt return.  Pt expressed understanding and is in agreement w/ plan.  

## 2013-10-21 ENCOUNTER — Ambulatory Visit (INDEPENDENT_AMBULATORY_CARE_PROVIDER_SITE_OTHER): Payer: BC Managed Care – PPO | Admitting: Physician Assistant

## 2013-10-21 ENCOUNTER — Encounter: Payer: Self-pay | Admitting: Physician Assistant

## 2013-10-21 VITALS — BP 112/80 | HR 79 | Temp 98.2°F | Resp 16 | Ht 58.25 in | Wt 168.5 lb

## 2013-10-21 DIAGNOSIS — R21 Rash and other nonspecific skin eruption: Secondary | ICD-10-CM

## 2013-10-21 MED ORDER — METHYLPREDNISOLONE (PAK) 4 MG PO TABS: ORAL_TABLET | ORAL | Status: DC

## 2013-10-21 NOTE — Progress Notes (Signed)
Pre visit review using our clinic review tool, if applicable. No additional management support is needed unless otherwise documented below in the visit note/SLS  

## 2013-10-21 NOTE — Progress Notes (Signed)
Patient presents to clinic today c/o several weeks or pruritic skin eruptions.  Patient endorses lesion on torso, upper and lower extremities.  Patient states lesions will "bubble" up and then turn into a bruise.  Patient with extensive history of allergies.  Has seen an allergist before and could not complete testing due to allergic reactions.  Patient denies new pet.  Denies recent travel.  Denies bug bite.  Denies change in hygiene product. No one else at home with similar symptoms.  Patient denies history of SLE or other autoimmune disease.  Has never seen her PCP for this issue.  Past Medical History  Diagnosis Date  . Migraines   . GERD (gastroesophageal reflux disease)   . Migraines     Current Outpatient Prescriptions on File Prior to Visit  Medication Sig Dispense Refill  . albuterol (PROVENTIL HFA;VENTOLIN HFA) 108 (90 BASE) MCG/ACT inhaler Inhale 2 puffs into the lungs every 4 (four) hours as needed for wheezing or shortness of breath (or cough).  1 Inhaler  3  . benzonatate (TESSALON) 100 MG capsule Take 2 capsules (200 mg total) by mouth 2 (two) times daily as needed for cough.  20 capsule  0  . budesonide-formoterol (SYMBICORT) 160-4.5 MCG/ACT inhaler Inhale 2 puffs into the lungs 2 (two) times daily.  1 Inhaler  5  . cetirizine (ZYRTEC) 10 MG tablet Take 10 mg by mouth daily.        . chlorpheniramine-HYDROcodone (TUSSIONEX PENNKINETIC ER) 10-8 MG/5ML LQCR Take 5 mLs by mouth every 12 (twelve) hours as needed.  115 mL  0  . EPINEPHrine (EPI-PEN) 0.3 mg/0.3 mL DEVI Inject 0.3 mLs (0.3 mg total) into the muscle once.  2 Device  3  . mometasone (NASONEX) 50 MCG/ACT nasal spray Place 2 sprays into the nose daily.  17 g  12  . rizatriptan (MAXALT) 5 MG tablet Take 1 tablet (5 mg total) by mouth as needed. For migraine. May repeat in 2 hours if needed  10 tablet  3   No current facility-administered medications on file prior to visit.    Allergies  Allergen Reactions  . Amoxicillin      Burns skin  . Banana     Throat itches  . Other     Dust Mites; Dogs, Cats  . Pineapple     Throat itches  . Shrimp [Shellfish Allergy]     Family History  Problem Relation Age of Onset  . Stroke Maternal Grandmother   . Hypertension Neg Hx   . Diabetes Neg Hx   . Cancer Neg Hx     History   Social History  . Marital Status: Married    Spouse Name: N/A    Number of Children: N/A  . Years of Education: N/A   Social History Main Topics  . Smoking status: Never Smoker   . Smokeless tobacco: None  . Alcohol Use: No  . Drug Use: No  . Sexual Activity:    Other Topics Concern  . None   Social History Narrative  . None   Review of Systems - See HPI.  All other ROS are negative.  Filed Vitals:   10/21/13 1414  BP: 112/80  Pulse: 79  Temp: 98.2 F (36.8 C)  Resp: 16   Physical Exam  Vitals reviewed. Constitutional: She is oriented to person, place, and time and well-developed, well-nourished, and in no distress.  HENT:  Head: Normocephalic and atraumatic.  Right Ear: External ear normal.  Left Ear: External  ear normal.  Nose: Nose normal.  Mouth/Throat: Oropharynx is clear and moist. No oropharyngeal exudate.  Eyes: Conjunctivae are normal. Pupils are equal, round, and reactive to light.  Neck: Neck supple.  Cardiovascular: Normal rate, regular rhythm, normal heart sounds and intact distal pulses.   Pulmonary/Chest: Effort normal and breath sounds normal. No respiratory distress. She has no wheezes. She has no rales. She exhibits no tenderness.  Lymphadenopathy:    She has no cervical adenopathy.  Neurological: She is alert and oriented to person, place, and time.  Skin: Skin is warm and dry.  Scattered erythematous macules of upper and lower extremities bilaterally, in various stages of healing. Rash is pruritic, nontender. There is evidence of excoriation. No evidence of drainage or secondary infection.  Psychiatric: Affect normal.      Assessment/Plan: No problem-specific assessment & plan notes found for this encounter.

## 2013-10-21 NOTE — Patient Instructions (Signed)
Take steroid pack as prescribed.  Avoid heat or excess sunlight exposure.  Benadryl at bedtime.  Cool compresses.  Sarna lotion.  I will call you with your lab results.  You will be contacted by a Dermatology office for an appointment date/time.

## 2013-10-22 DIAGNOSIS — R21 Rash and other nonspecific skin eruption: Secondary | ICD-10-CM | POA: Insufficient documentation

## 2013-10-22 LAB — SEDIMENTATION RATE: Sed Rate: 10 mm/hr (ref 0–22)

## 2013-10-22 LAB — ANA: Anti Nuclear Antibody(ANA): NEGATIVE

## 2013-10-22 NOTE — Assessment & Plan Note (Signed)
Rx Medrol Dosepak. Question allergic contact dermatitis versus bug bites. No else at home with similar symptoms. Will place referral to dermatology for possible biopsy due to persistence of symptoms. Will obtain sedimentation rate and ANA to rule out autoimmune causes of rash.

## 2014-02-25 ENCOUNTER — Ambulatory Visit: Payer: BC Managed Care – PPO | Admitting: Internal Medicine

## 2014-02-25 ENCOUNTER — Encounter: Payer: Self-pay | Admitting: Family

## 2014-02-25 ENCOUNTER — Ambulatory Visit (INDEPENDENT_AMBULATORY_CARE_PROVIDER_SITE_OTHER): Payer: BC Managed Care – PPO | Admitting: Family

## 2014-02-25 VITALS — BP 100/70 | HR 77 | Temp 98.2°F | Resp 16 | Ht 58.25 in | Wt 169.1 lb

## 2014-02-25 DIAGNOSIS — J019 Acute sinusitis, unspecified: Secondary | ICD-10-CM

## 2014-02-25 DIAGNOSIS — J329 Chronic sinusitis, unspecified: Secondary | ICD-10-CM

## 2014-02-25 MED ORDER — CLARITHROMYCIN 500 MG PO TABS: 500.0000 mg | ORAL_TABLET | Freq: Two times a day (BID) | ORAL | Status: DC

## 2014-02-25 MED ORDER — FLUCONAZOLE 150 MG PO TABS: ORAL_TABLET | ORAL | Status: DC

## 2014-02-25 NOTE — Progress Notes (Signed)
Subjective:    Patient ID: Michele Roberts, female    DOB: 02/09/1976, 38 y.o.   MRN: [id number]  HPI  [name] is a 38 yr old female who presents today with chief complaint of facial pain. Reports symptoms started on Friday and was assiocated with left ear pain. Reports unday she woke up with swelling around the right eye. She denies known fever but has felt warm. Uses zyrtec and sudafed.  Sudafed helped briefly.  Reports previous hx of sinus infections.  Reports that she gets sinus infection every 1-2 months. Never seen ENT specialist.    Review of Systems    see HPI  Past Medical History  Diagnosis Date  . Migraines   . GERD (gastroesophageal reflux disease)   . Migraines     History   Social History  . Marital Status: Married    Spouse Name: N/A    Number of Children: N/A  . Years of Education: N/A   Occupational History  . Not on file.   Social History Main Topics  . Smoking status: Never Smoker   . Smokeless tobacco: Not on file  . Alcohol Use: No  . Drug Use: No  . Sexual Activity:    Other Topics Concern  . Not on file   Social History Narrative  . No narrative on file    Past Surgical History  Procedure Laterality Date  . Appendectomy    . Cesarean section      Family History  Problem Relation Age of Onset  . Stroke Maternal Grandmother   . Hypertension Neg Hx   . Diabetes Neg Hx   . Cancer Neg Hx     Allergies  Allergen Reactions  . Amoxicillin     Burns skin  . Banana     Throat itches  . Other     Dust Mites; Dogs, Cats  . Pineapple     Throat itches  . Shrimp [Shellfish Allergy]     Current Outpatient Prescriptions on File Prior to Visit  Medication Sig Dispense Refill  . albuterol (PROVENTIL HFA;VENTOLIN HFA) 108 (90 BASE) MCG/ACT inhaler Inhale 2 puffs into the lungs every 4 (four) hours as needed for wheezing or shortness of breath (or cough).  1 Inhaler  3  . budesonide-formoterol (SYMBICORT) 160-4.5 MCG/ACT inhaler  Inhale 2 puffs into the lungs 2 (two) times daily.  1 Inhaler  5  . cetirizine (ZYRTEC) 10 MG tablet Take 10 mg by mouth daily.        Marland Kitchen. EPINEPHrine (EPI-PEN) 0.3 mg/0.3 mL DEVI Inject 0.3 mLs (0.3 mg total) into the muscle once.  2 Device  3  . mometasone (NASONEX) 50 MCG/ACT nasal spray Place 2 sprays into the nose daily.  17 g  12  . rizatriptan (MAXALT) 5 MG tablet Take 1 tablet (5 mg total) by mouth as needed. For migraine. May repeat in 2 hours if needed  10 tablet  3   No current facility-administered medications on file prior to visit.    BP 100/70  Pulse 77  Temp(Src) 98.2 F (36.8 C) (Oral)  Resp 16  Ht 4' 10.25" (1.48 m)  Wt 169 lb 1.9 oz (76.712 kg)  BMI 35.02 kg/m2  SpO2 99%    Objective:   Physical Exam  Constitutional: She is oriented to person, place, and time. She appears well-developed and well-nourished. No distress.  HENT:  Head: Normocephalic and atraumatic.  Right Ear: Tympanic membrane and ear canal normal.  L  TM is dull + L frontal and L maxillary sinus tenderness to palpation  Cardiovascular: Normal rate and regular rhythm.   No murmur heard. Pulmonary/Chest: Effort normal and breath sounds normal. No respiratory distress. She has no wheezes. She has no rales. She exhibits no tenderness.  Musculoskeletal: She exhibits no edema.  Neurological: She is alert and oriented to person, place, and time.  Skin: Skin is warm and dry.  Psychiatric: She has a normal mood and affect. Her behavior is normal. Judgment and thought content normal.          Assessment & Plan:

## 2014-02-25 NOTE — Assessment & Plan Note (Signed)
Will rx with biaxin x 14 days. Given recurrent nature of her sinus infections, will also refer to ENT for further evaluation.  Pt advised to call if symptoms worsen or if symptoms do not improve.

## 2014-02-25 NOTE — Patient Instructions (Addendum)
Start Biaxin. Continue zyrtec and nasal steroid spray (nasonex or flonase) Call if symptoms worsen or if symptoms are not improved in 2-3 days. You will be contacted about your referral to ENT.

## 2014-02-25 NOTE — Progress Notes (Signed)
Pre visit review using our clinic review tool, if applicable. No additional management support is needed unless otherwise documented below in the visit note. 

## 2014-05-05 ENCOUNTER — Encounter: Payer: Self-pay | Admitting: Physician Assistant

## 2014-05-05 ENCOUNTER — Ambulatory Visit (INDEPENDENT_AMBULATORY_CARE_PROVIDER_SITE_OTHER): Payer: BC Managed Care – PPO | Admitting: Physician Assistant

## 2014-05-05 VITALS — BP 104/78 | HR 91 | Temp 98.6°F | Resp 16 | Ht 58.25 in | Wt 166.0 lb

## 2014-05-05 DIAGNOSIS — H65192 Other acute nonsuppurative otitis media, left ear: Secondary | ICD-10-CM

## 2014-05-05 DIAGNOSIS — J0100 Acute maxillary sinusitis, unspecified: Secondary | ICD-10-CM | POA: Insufficient documentation

## 2014-05-05 DIAGNOSIS — H65199 Other acute nonsuppurative otitis media, unspecified ear: Secondary | ICD-10-CM

## 2014-05-05 DIAGNOSIS — H6690 Otitis media, unspecified, unspecified ear: Secondary | ICD-10-CM | POA: Insufficient documentation

## 2014-05-05 DIAGNOSIS — G43909 Migraine, unspecified, not intractable, without status migrainosus: Secondary | ICD-10-CM

## 2014-05-05 MED ORDER — RIZATRIPTAN BENZOATE 5 MG PO TABS: 5.0000 mg | ORAL_TABLET | ORAL | Status: DC | PRN

## 2014-05-05 MED ORDER — EPINEPHRINE 0.3 MG/0.3ML IJ SOAJ: 0.3000 mg | Freq: Once | INTRAMUSCULAR | Status: DC

## 2014-05-05 MED ORDER — AZITHROMYCIN 250 MG PO TABS: ORAL_TABLET | ORAL | Status: DC

## 2014-05-05 MED ORDER — FLUCONAZOLE 150 MG PO TABS: 150.0000 mg | ORAL_TABLET | Freq: Once | ORAL | Status: DC

## 2014-05-05 NOTE — Progress Notes (Signed)
Patient presents to clinic today c/o several days of sinus pressure, sinus pain, ear pain and fatigue with L>R.  Patient with history of allergies and a few episodes of sinusitis per year.  Endorses intermittent fevers.  Denies chest pain, cough or SOB.  Denies recent travel or sick contact.  Past Medical History  Diagnosis Date  . Migraines   . GERD (gastroesophageal reflux disease)   . Migraines     Current Outpatient Prescriptions on File Prior to Visit  Medication Sig Dispense Refill  . albuterol (PROVENTIL HFA;VENTOLIN HFA) 108 (90 BASE) MCG/ACT inhaler Inhale 2 puffs into the lungs every 4 (four) hours as needed for wheezing or shortness of breath (or cough).  1 Inhaler  3  . budesonide-formoterol (SYMBICORT) 160-4.5 MCG/ACT inhaler Inhale 2 puffs into the lungs 2 (two) times daily.  1 Inhaler  5  . cetirizine (ZYRTEC) 10 MG tablet Take 10 mg by mouth daily.        . mometasone (NASONEX) 50 MCG/ACT nasal spray Place 2 sprays into the nose daily.  17 g  12   No current facility-administered medications on file prior to visit.    Allergies  Allergen Reactions  . Amoxicillin     Burns skin  . Banana     Throat itches  . Other     Dust Mites; Dogs, Cats  . Pineapple     Throat itches  . Shrimp [Shellfish Allergy]     Family History  Problem Relation Age of Onset  . Stroke Maternal Grandmother   . Hypertension Neg Hx   . Diabetes Neg Hx   . Cancer Neg Hx     History   Social History  . Marital Status: Married    Spouse Name: N/A    Number of Children: N/A  . Years of Education: N/A   Social History Main Topics  . Smoking status: Never Smoker   . Smokeless tobacco: None  . Alcohol Use: No  . Drug Use: No  . Sexual Activity:    Other Topics Concern  . None   Social History Narrative  . None   Review of Systems - See HPI.  All other ROS are negative.  BP 104/78  Pulse 91  Temp(Src) 98.6 F (37 C) (Oral)  Resp 16  Ht 4' 10.25" (1.48 m)  Wt 166 lb  (75.297 kg)  BMI 34.38 kg/m2  SpO2 100%  Physical Exam  Vitals reviewed. Constitutional: She is oriented to person, place, and time and well-developed, well-nourished, and in no distress.  HENT:  Head: Normocephalic and atraumatic.  R tympanic membrane within normal limits. L TM erythematous and bulging.  + TTP of left maxillary sinuses noted on exam.  Eyes: Conjunctivae are normal. Pupils are equal, round, and reactive to light.  Neck: Neck supple.  Cardiovascular: Normal rate, regular rhythm, normal heart sounds and intact distal pulses.   Pulmonary/Chest: Effort normal and breath sounds normal. No respiratory distress. She has no wheezes. She has no rales. She exhibits no tenderness.  Lymphadenopathy:    She has no cervical adenopathy.  Neurological: She is alert and oriented to person, place, and time.  Skin: Skin is warm and dry. No rash noted.  Psychiatric: Affect normal.   Assessment/Plan: Acute maxillary sinusitis Rx Azithromycin.  Increase fluids.  Benadryl to help with allergies and sleep. Mucinex. Saline nasal spray.  Flonase daily.  Humidifier in bedroom.  Call or RTC if symptoms are not improving.  Otitis media Rx Azithromycin.

## 2014-05-05 NOTE — Patient Instructions (Signed)
Please take antibiotic as directed.  Increase fluid intake.  Can use Mucinex for congestion. Use Saline nasal spray.  Take a daily multivitamin. Take a daily benadryl for allergies and sleep.  Place a humidifier in the bedroom.  Please call or return clinic if symptoms are not improving.  Sinusitis Sinusitis is redness, soreness, and swelling (inflammation) of the paranasal sinuses. Paranasal sinuses are air pockets within the bones of your face (beneath the eyes, the middle of the forehead, or above the eyes). In healthy paranasal sinuses, mucus is able to drain out, and air is able to circulate through them by way of your nose. However, when your paranasal sinuses are inflamed, mucus and air can become trapped. This can allow bacteria and other germs to grow and cause infection. Sinusitis can develop quickly and last only a short time (acute) or continue over a long period (chronic). Sinusitis that lasts for more than 12 weeks is considered chronic.  CAUSES  Causes of sinusitis include:  Allergies.  Structural abnormalities, such as displacement of the cartilage that separates your nostrils (deviated septum), which can decrease the air flow through your nose and sinuses and affect sinus drainage.  Functional abnormalities, such as when the small hairs (cilia) that line your sinuses and help remove mucus do not work properly or are not present. SYMPTOMS  Symptoms of acute and chronic sinusitis are the same. The primary symptoms are pain and pressure around the affected sinuses. Other symptoms include:  Upper toothache.  Earache.  Headache.  Bad breath.  Decreased sense of smell and taste.  A cough, which worsens when you are lying flat.  Fatigue.  Fever.  Thick drainage from your nose, which often is green and may contain pus (purulent).  Swelling and warmth over the affected sinuses. DIAGNOSIS  Your caregiver will perform a physical exam. During the exam, your caregiver  may:  Look in your nose for signs of abnormal growths in your nostrils (nasal polyps).  Tap over the affected sinus to check for signs of infection.  View the inside of your sinuses (endoscopy) with a special imaging device with a light attached (endoscope), which is inserted into your sinuses. If your caregiver suspects that you have chronic sinusitis, one or more of the following tests may be recommended:  Allergy tests.  Nasal culture A sample of mucus is taken from your nose and sent to a lab and screened for bacteria.  Nasal cytology A sample of mucus is taken from your nose and examined by your caregiver to determine if your sinusitis is related to an allergy. TREATMENT  Most cases of acute sinusitis are related to a viral infection and will resolve on their own within 10 days. Sometimes medicines are prescribed to help relieve symptoms (pain medicine, decongestants, nasal steroid sprays, or saline sprays).  However, for sinusitis related to a bacterial infection, your caregiver will prescribe antibiotic medicines. These are medicines that will help kill the bacteria causing the infection.  Rarely, sinusitis is caused by a fungal infection. In theses cases, your caregiver will prescribe antifungal medicine. For some cases of chronic sinusitis, surgery is needed. Generally, these are cases in which sinusitis recurs more than 3 times per year, despite other treatments. HOME CARE INSTRUCTIONS   Drink plenty of water. Water helps thin the mucus so your sinuses can drain more easily.  Use a humidifier.  Inhale steam 3 to 4 times a day (for example, sit in the bathroom with the shower running).  Apply  a warm, moist washcloth to your face 3 to 4 times a day, or as directed by your caregiver.  Use saline nasal sprays to help moisten and clean your sinuses.  Take over-the-counter or prescription medicines for pain, discomfort, or fever only as directed by your caregiver. SEEK IMMEDIATE  MEDICAL CARE IF:  You have increasing pain or severe headaches.  You have nausea, vomiting, or drowsiness.  You have swelling around your face.  You have vision problems.  You have a stiff neck.  You have difficulty breathing. MAKE SURE YOU:   Understand these instructions.  Will watch your condition.  Will get help right away if you are not doing well or get worse. Document Released: 09/26/2005 Document Revised: 12/19/2011 Document Reviewed: 10/11/2011 [facility] Patient Information 2014 [place], [place].

## 2014-05-05 NOTE — Progress Notes (Signed)
Pre visit review using our clinic review tool, if applicable. No additional management support is needed unless otherwise documented below in the visit note/SLS  

## 2014-05-05 NOTE — Assessment & Plan Note (Signed)
Rx Azithromycin.   

## 2014-05-05 NOTE — Assessment & Plan Note (Signed)
Rx Azithromycin.  Increase fluids.  Benadryl to help with allergies and sleep. Mucinex. Saline nasal spray.  Flonase daily.  Humidifier in bedroom.  Call or RTC if symptoms are not improving.

## 2014-08-06 ENCOUNTER — Encounter (HOSPITAL_BASED_OUTPATIENT_CLINIC_OR_DEPARTMENT_OTHER): Payer: Self-pay | Admitting: Emergency Medicine

## 2014-08-06 ENCOUNTER — Emergency Department (HOSPITAL_BASED_OUTPATIENT_CLINIC_OR_DEPARTMENT_OTHER)
Admission: EM | Admit: 2014-08-06 | Discharge: 2014-08-06 | Disposition: A | Discharge status: Home / Self Care | Payer: BC Managed Care – PPO | Attending: Emergency Medicine | Admitting: Emergency Medicine

## 2014-08-06 ENCOUNTER — Emergency Department (HOSPITAL_BASED_OUTPATIENT_CLINIC_OR_DEPARTMENT_OTHER): Payer: BC Managed Care – PPO

## 2014-08-06 DIAGNOSIS — N898 Other specified noninflammatory disorders of vagina: Secondary | ICD-10-CM | POA: Diagnosis not present

## 2014-08-06 DIAGNOSIS — Z9889 Other specified postprocedural states: Secondary | ICD-10-CM | POA: Diagnosis not present

## 2014-08-06 DIAGNOSIS — R109 Unspecified abdominal pain: Secondary | ICD-10-CM

## 2014-08-06 DIAGNOSIS — R1033 Periumbilical pain: Secondary | ICD-10-CM | POA: Diagnosis not present

## 2014-08-06 DIAGNOSIS — R197 Diarrhea, unspecified: Secondary | ICD-10-CM | POA: Insufficient documentation

## 2014-08-06 DIAGNOSIS — Z3202 Encounter for pregnancy test, result negative: Secondary | ICD-10-CM | POA: Diagnosis not present

## 2014-08-06 DIAGNOSIS — Z880 Allergy status to penicillin: Secondary | ICD-10-CM | POA: Insufficient documentation

## 2014-08-06 DIAGNOSIS — Z9089 Acquired absence of other organs: Secondary | ICD-10-CM | POA: Insufficient documentation

## 2014-08-06 DIAGNOSIS — Z792 Long term (current) use of antibiotics: Secondary | ICD-10-CM | POA: Insufficient documentation

## 2014-08-06 DIAGNOSIS — Z7951 Long term (current) use of inhaled steroids: Secondary | ICD-10-CM | POA: Diagnosis not present

## 2014-08-06 HISTORY — DX: Unspecified asthma, uncomplicated: J45.909

## 2014-08-06 LAB — COMPREHENSIVE METABOLIC PANEL
ALBUMIN: 3.4 g/dL — AB (ref 3.5–5.2)
ALT: 53 U/L — ABNORMAL HIGH (ref 0–35)
ANION GAP: 12 (ref 5–15)
AST: 34 U/L (ref 0–37)
Alkaline Phosphatase: 82 U/L (ref 39–117)
BUN: 9 mg/dL (ref 6–23)
CO2: 27 mEq/L (ref 19–32)
CREATININE: 0.7 mg/dL (ref 0.50–1.10)
Calcium: 9.2 mg/dL (ref 8.4–10.5)
Chloride: 102 mEq/L (ref 96–112)
GFR calc Af Amer: >90 mL/min (ref >90)
GFR calc non Af Amer: >90 mL/min (ref >90)
GLUCOSE: 131 mg/dL — AB (ref 70–99)
Potassium: 3.9 mEq/L (ref 3.7–5.3)
Sodium: 141 mEq/L (ref 137–147)
TOTAL PROTEIN: 7.1 g/dL (ref 6.0–8.3)
Total Bilirubin: 0.3 mg/dL (ref 0.3–1.2)

## 2014-08-06 LAB — CBC WITH DIFFERENTIAL/PLATELET
BASOS PCT: 0 % (ref 0–1)
Basophils Absolute: 0 10*3/uL (ref 0.0–0.1)
EOS ABS: 0.1 10*3/uL (ref 0.0–0.7)
EOS PCT: 1 % (ref 0–5)
HCT: 37.7 % (ref 36.0–46.0)
HEMOGLOBIN: 12.1 g/dL (ref 12.0–15.0)
LYMPHS ABS: 3 10*3/uL (ref 0.7–4.0)
Lymphocytes Relative: 27 % (ref 12–46)
MCH: 27.2 pg (ref 26.0–34.0)
MCHC: 32.1 g/dL (ref 30.0–36.0)
MCV: 84.7 fL (ref 78.0–100.0)
MONO ABS: 0.9 10*3/uL (ref 0.1–1.0)
MONOS PCT: 8 % (ref 3–12)
Neutro Abs: 7.3 10*3/uL (ref 1.7–7.7)
Neutrophils Relative %: 64 % (ref 43–77)
Platelets: 308 10*3/uL (ref 150–400)
RBC: 4.45 MIL/uL (ref 3.87–5.11)
RDW: 12.8 % (ref 11.5–15.5)
WBC: 11.3 10*3/uL — ABNORMAL HIGH (ref 4.0–10.5)

## 2014-08-06 LAB — URINALYSIS, ROUTINE W REFLEX MICROSCOPIC
Bilirubin Urine: NEGATIVE
GLUCOSE, UA: NEGATIVE mg/dL
HGB URINE DIPSTICK: NEGATIVE
Ketones, ur: NEGATIVE mg/dL
Leukocytes, UA: NEGATIVE
Nitrite: NEGATIVE
PROTEIN: NEGATIVE mg/dL
Specific Gravity, Urine: 1.015 (ref 1.005–1.030)
Urobilinogen, UA: 0.2 mg/dL (ref 0.0–1.0)
pH: 6 (ref 5.0–8.0)

## 2014-08-06 LAB — WET PREP, GENITAL
Clue Cells Wet Prep HPF POC: NONE SEEN
Trich, Wet Prep: NONE SEEN
Yeast Wet Prep HPF POC: NONE SEEN

## 2014-08-06 LAB — LIPASE, BLOOD: LIPASE: 30 U/L (ref 11–59)

## 2014-08-06 LAB — PREGNANCY, URINE: PREG TEST UR: NEGATIVE

## 2014-08-06 MED ORDER — MORPHINE SULFATE 4 MG/ML IJ SOLN
4.0000 mg | Freq: Once | INTRAMUSCULAR | Status: AC
  Administered 2014-08-06: 4 mg via INTRAVENOUS
  Filled 2014-08-06: qty 1

## 2014-08-06 MED ORDER — IOHEXOL 300 MG/ML  SOLN
100.0000 mL | Freq: Once | INTRAMUSCULAR | Status: AC | PRN
  Administered 2014-08-06: 100 mL via INTRAVENOUS

## 2014-08-06 MED ORDER — SODIUM CHLORIDE 0.9 % IV BOLUS (SEPSIS)
1000.0000 mL | Freq: Once | INTRAVENOUS | Status: AC
  Administered 2014-08-06: 1000 mL via INTRAVENOUS

## 2014-08-06 MED ORDER — IOHEXOL 300 MG/ML  SOLN
25.0000 mL | Freq: Once | INTRAMUSCULAR | Status: AC | PRN
  Administered 2014-08-06: 25 mL via ORAL

## 2014-08-06 MED ORDER — ONDANSETRON HCL 4 MG/2ML IJ SOLN
4.0000 mg | Freq: Once | INTRAMUSCULAR | Status: AC
  Administered 2014-08-06: 4 mg via INTRAVENOUS
  Filled 2014-08-06: qty 2

## 2014-08-06 NOTE — ED Notes (Signed)
Pt having low abd pain and diarrhea for 3-4 days with bil low back pain.  Sts just 15 min ago she starting having sharp pain around navel.  Pain in right hip/low back is making it difficult for her to walk.

## 2014-08-06 NOTE — ED Notes (Signed)
MD at bedside. 

## 2014-08-06 NOTE — Discharge Instructions (Signed)
Abdominal Pain, Women °Abdominal (stomach, pelvic, or belly) pain can be caused by many things. It is important to tell your doctor: °· The location of the pain. °· Does it come and go or is it present all the time? °· Are there things that start the pain (eating certain foods, exercise)? °· Are there other symptoms associated with the pain (fever, nausea, vomiting, diarrhea)? °All of this is helpful to know when trying to find the cause of the pain. °CAUSES  °· Stomach: virus or bacteria infection, or ulcer. °· Intestine: appendicitis (inflamed appendix), regional ileitis (Crohn's disease), ulcerative colitis (inflamed colon), irritable bowel syndrome, diverticulitis (inflamed diverticulum of the colon), or cancer of the stomach or intestine. °· Gallbladder disease or stones in the gallbladder. °· Kidney disease, kidney stones, or infection. °· Pancreas infection or cancer. °· Fibromyalgia (pain disorder). °· Diseases of the female organs: °¨ Uterus: fibroid (non-cancerous) tumors or infection. °¨ Fallopian tubes: infection or tubal pregnancy. °¨ Ovary: cysts or tumors. °¨ Pelvic adhesions (scar tissue). °¨ Endometriosis (uterus lining tissue growing in the pelvis and on the pelvic organs). °¨ Pelvic congestion syndrome (female organs filling up with blood just before the menstrual period). °¨ Pain with the menstrual period. °¨ Pain with ovulation (producing an egg). °¨ Pain with an IUD (intrauterine device, birth control) in the uterus. °¨ Cancer of the female organs. °· Functional pain (pain not caused by a disease, may improve without treatment). °· Psychological pain. °· Depression. °DIAGNOSIS  °Your doctor will decide the seriousness of your pain by doing an examination. °· Blood tests. °· X-rays. °· Ultrasound. °· CT scan (computed tomography, special type of X-ray). °· MRI (magnetic resonance imaging). °· Cultures, for infection. °· Barium enema (dye inserted in the large intestine, to better view it with  X-rays). °· Colonoscopy (looking in intestine with a lighted tube). °· Laparoscopy (minor surgery, looking in abdomen with a lighted tube). °· Major abdominal exploratory surgery (looking in abdomen with a large incision). °TREATMENT  °The treatment will depend on the cause of the pain.  °· Many cases can be observed and treated at home. °· Over-the-counter medicines recommended by your caregiver. °· Prescription medicine. °· Antibiotics, for infection. °· Birth control pills, for painful periods or for ovulation pain. °· Hormone treatment, for endometriosis. °· Nerve blocking injections. °· Physical therapy. °· Antidepressants. °· Counseling with a psychologist or psychiatrist. °· Minor or major surgery. °HOME CARE INSTRUCTIONS  °· Do not take laxatives, unless directed by your caregiver. °· Take over-the-counter pain medicine only if ordered by your caregiver. Do not take aspirin because it can cause an upset stomach or bleeding. °· Try a clear liquid diet (broth or water) as ordered by your caregiver. Slowly move to a bland diet, as tolerated, if the pain is related to the stomach or intestine. °· Have a thermometer and take your temperature several times a day, and record it. °· Bed rest and sleep, if it helps the pain. °· Avoid sexual intercourse, if it causes pain. °· Avoid stressful situations. °· Keep your follow-up appointments and tests, as your caregiver orders. °· If the pain does not go away with medicine or surgery, you may try: °¨ Acupuncture. °¨ Relaxation exercises (yoga, meditation). °¨ Group therapy. °¨ Counseling. °SEEK MEDICAL CARE IF:  °· You notice certain foods cause stomach pain. °· Your home care treatment is not helping your pain. °· You need stronger pain medicine. °· You want your IUD removed. °· You feel faint or   lightheaded. °· You develop nausea and vomiting. °· You develop a rash. °· You are having side effects or an allergy to your medicine. °SEEK IMMEDIATE MEDICAL CARE IF:  °· Your  pain does not go away or gets worse. °· You have a fever. °· Your pain is felt only in portions of the abdomen. The right side could possibly be appendicitis. The left lower portion of the abdomen could be colitis or diverticulitis. °· You are passing blood in your stools (bright red or black tarry stools, with or without vomiting). °· You have blood in your urine. °· You develop chills, with or without a fever. °· You pass out. °MAKE SURE YOU:  °· Understand these instructions. °· Will watch your condition. °· Will get help right away if you are not doing well or get worse. °Document Released: 07/24/2007 Document Revised: 02/10/2014 Document Reviewed: 08/13/2009 °[employer]® Patient Information ©2015 [employer]. This information is not intended to replace advice given to you by your health care provider. Make sure you discuss any questions you have with your health care provider. ° °

## 2014-08-06 NOTE — ED Notes (Signed)
Left message for MD requesting pain med.  No orders yet.

## 2014-08-06 NOTE — ED Provider Notes (Signed)
CSN: [id number]     Arrival date & time 08/06/14  2039 History  This chart was scribed for Warnell Forester, MD by Gwenyth Ober, ED Scribe. This patient was seen in room MH05/MH05 and the patient's care was started at 8:57 PM.    Chief Complaint  Patient presents with  . Abdominal Pain   Patient is a 38 y.o. female presenting with abdominal pain. The history is provided by the patient. No language interpreter was used.  Abdominal Pain Pain location:  Suprapubic and periumbilical Pain quality: pressure and sharp   Pain radiates to:  Back and periumbilical region Pain severity:  Moderate Onset quality:  Sudden Duration:  1 day Timing:  Constant Progression:  Waxing and waning Chronicity:  New Associated symptoms: chills, diarrhea, dysuria, fever (subjective) and nausea   Associated symptoms: no hematochezia, no hematuria, no vaginal discharge and no vomiting   Diarrhea:    Quality:  Unable to specify   Severity:  Moderate   Duration:  5 days   Timing:  Intermittent   Progression:  Unchanged  HPI Comments: Michele Roberts is a 38 y.o. female who presents to the Emergency Department complaining of constant, waxing and waning, sharp, lower abdominal pain that radiates to her umbilicus and right lower back and occurred earlier this evening after eating. Pt states chills, subjective fever, nausea, dysuria and one episode of dizziness that occurred earlier today associated symptoms. Pt also reports intermittent diarrhea that occurs 15 minutes after eating and started 5 days ago. She notes that symptoms were also accompanied by sharp back pain, lower abdominal pressure, and increased frequency of urination last night. Pt states she went to PCP 2 weeks ago for URI and received a steroid shot and Z-pack with some relief to symptoms. Within a few days, she states symptoms returned and she felt fatigued. Pt states she has an IUD. After a [place], her gynecologist told her that her IUD has moved deep into  uterus and has to be surgically removed. She denies vaginal discharge, hematuria, blood in stool, and vomiting.    Past Medical History  Diagnosis Date  . Migraines   . GERD (gastroesophageal reflux disease)   . Migraines    Past Surgical History  Procedure Laterality Date  . Appendectomy    . Cesarean section     Family History  Problem Relation Age of Onset  . Stroke Maternal Grandmother   . Hypertension Neg Hx   . Diabetes Neg Hx   . Cancer Neg Hx    History  Substance Use Topics  . Smoking status: Never Smoker   . Smokeless tobacco: Not on file  . Alcohol Use: No   OB History   Grav Para Term Preterm Abortions TAB SAB Ect Mult Living                 Review of Systems  Constitutional: Positive for fever (subjective) and chills.  Gastrointestinal: Positive for nausea, abdominal pain and diarrhea. Negative for vomiting and hematochezia.  Genitourinary: Positive for dysuria. Negative for hematuria and vaginal discharge.  All other systems reviewed and are negative.     Allergies  Amoxicillin; Banana; Other; Pineapple; and Shrimp  Home Medications   Prior to Admission medications   Medication Sig Start Date End Date Taking? Authorizing Provider  albuterol (PROVENTIL HFA;VENTOLIN HFA) 108 (90 BASE) MCG/ACT inhaler Inhale 2 puffs into the lungs every 4 (four) hours as needed for wheezing or shortness of breath (or cough). 11/23/11   Natalia Leatherwood  Assunta FoundE Tabori, MD  azithromycin (ZITHROMAX) 250 MG tablet Take 2 tablets on Day 1.  Then take 1 tablet daily. 05/05/14   Waldon MerlWilliam C Martin, PA-C  budesonide-formoterol [facility](SYMBICORT) 160-4.5 MCG/ACT inhaler Inhale 2 puffs into the lungs 2 (two) times daily. 01/17/13   Sheliah HatchKatherine E Tabori, MD  cetirizine (ZYRTEC) 10 MG tablet Take 10 mg by mouth daily.      Historical Provider, MD  EPINEPHrine 0.3 mg/0.3 mL IJ SOAJ injection Inject 0.3 mLs (0.3 mg total) into the muscle once. 05/05/14   Waldon MerlWilliam C Martin, PA-C  fluconazole (DIFLUCAN) 150 MG  tablet Take 1 tablet (150 mg total) by mouth once. 05/05/14   Waldon MerlWilliam C Martin, PA-C  mometasone (NASONEX) 50 MCG/ACT nasal spray Place 2 sprays into the nose daily. 11/23/11 05/05/14  Sheliah HatchKatherine E Tabori, MD  rizatriptan (MAXALT) 5 MG tablet Take 1 tablet (5 mg total) by mouth as needed. For migraine. May repeat in 2 hours if needed 05/05/14   Waldon MerlWilliam C Martin, PA-C   BP 98/60  Pulse 76  Temp(Src) 98.9 F (37.2 C) (Oral)  Resp 16  Ht 4\' 11"  (1.499 m)  Wt 167 lb (75.751 kg)  BMI 33.71 kg/m2  SpO2 100% Physical Exam  Nursing note and vitals reviewed. Constitutional: She is oriented to person, place, and time. She appears well-developed and well-nourished. No distress.  HENT:  Head: Normocephalic and atraumatic.  Mouth/Throat: Oropharynx is clear and moist.  Eyes: Conjunctivae are normal. Pupils are equal, round, and reactive to light. No scleral icterus.  Neck: Neck supple.  Cardiovascular: Normal rate, regular rhythm, normal heart sounds and intact distal pulses.   No murmur heard. Pulmonary/Chest: Effort normal and breath sounds normal. No stridor. No respiratory distress. She has no rales.  Abdominal: Soft. Bowel sounds are normal. She exhibits no distension. There is tenderness in the right lower quadrant, periumbilical area, suprapubic area and left lower quadrant. There is no rigidity, no rebound, no guarding and no CVA tenderness.  Genitourinary: There is no lesion or injury on the right labia. There is no lesion or injury on the left labia. Uterus is tender. Uterus is not enlarged. Cervix exhibits no motion tenderness and no friability. Right adnexum displays tenderness. Right adnexum displays no mass and no fullness. Left adnexum displays tenderness. Left adnexum displays no mass and no fullness. No tenderness around the vagina. Vaginal discharge (mild amount of white discharge) found.  Nonfocal pelvic and lower abdominal tenderness  Musculoskeletal: Normal range of motion.   Neurological: She is alert and oriented to person, place, and time.  Skin: Skin is warm and dry. No rash noted.  Psychiatric: She has a normal mood and affect. Her behavior is normal.    ED Course  Procedures (including critical care time) DIAGNOSTIC STUDIES: Oxygen Saturation is 100% on RA, normal by my interpretation.    COORDINATION OF CARE: 9:10 PM Discussed treatment plan with pt at bedside and pt agreed to plan.   Labs Review Labs Reviewed  WET PREP, GENITAL - Abnormal; Notable for the following:    WBC, Wet Prep HPF POC MODERATE (*)    All other components within normal limits  CBC WITH DIFFERENTIAL - Abnormal; Notable for the following:    WBC 11.3 (*)    All other components within normal limits  COMPREHENSIVE METABOLIC PANEL - Abnormal; Notable for the following:    Glucose, Bld 131 (*)    Albumin 3.4 (*)    ALT 53 (*)    All other components  within normal limits  GC/CHLAMYDIA PROBE AMP  URINALYSIS, ROUTINE W REFLEX MICROSCOPIC  PREGNANCY, URINE  LIPASE, BLOOD    Imaging Review Ct Abdomen Pelvis W Contrast  08/06/2014   CLINICAL DATA:  Initial evaluation for acute mid abdominal pain. Nausea. History of appendectomy, C-section.  EXAM: CT ABDOMEN AND PELVIS WITH CONTRAST  TECHNIQUE: Multidetector CT imaging of the abdomen and pelvis was performed using the standard protocol following bolus administration of intravenous contrast.  CONTRAST:  25mL OMNIPAQUE IOHEXOL 300 MG/ML SOLN, 100mL OMNIPAQUE IOHEXOL 300 MG/ML SOLN  COMPARISON:  Prior radiograph from 06/06/2011  FINDINGS: The visualized lung bases are clear.  The liver demonstrates a normal contrast enhanced appearance. Gallbladder within normal limits. No biliary dilatation. The spleen, adrenal glands, and pancreas demonstrate a normal contrast enhanced appearance.  Kidneys are equal in size with symmetric enhancement. No nephrolithiasis, hydronephrosis, or focal enhancing renal mass.  Stomach within normal limits. No  evidence of bowel obstruction. Appendix not visualize, compatible with history prior appendectomy. No abnormal wall thickening, mucosal enhancement, or inflammatory fat stranding seen about the bowels to suggest active inflammation.  Bladder within normal limits. IUD in place within the uterus. Ovaries unremarkable.  No free air or fluid. No adenopathy. Normal intravascular enhancement seen within the abdomen and pelvis.  No acute osseous abnormality. No worrisome lytic or blastic osseous lesions. Mild degenerative disc desiccation present at L4-5.  IMPRESSION: No acute intra-abdominal or pelvic process identified. No findings to explain abdominal pain.   Electronically Signed   By: Rise MuBenjamin  McClintock M.D.   On: 08/06/2014 22:14  All radiology studies independently viewed by me.      EKG Interpretation None      MDM   Final diagnoses:  Abdominal pain  Periumbilical abdominal pain    38 year old female with suprapubic and periumbilical abdominal pain. She has also had diarrhea for the past few days. On exam, she is well-appearing and nontoxic. She has a minimal leukocytosis, but otherwise her labs are unremarkable. CT scan of her abdomen and pelvis did not demonstrate cause for her abdominal pain. She has nonfocal lower abdominal and pelvic tenderness.  Her exam is not consistent with PID, ovarian torsion, or TOA. I don't think she needs a pelvic ultrasound tonight. Advised close outpatient follow-up and have given return precautions.  Of note, She has had some pain from her IUD recently, which she has discussed with her gynecologist. They're working on scheduling her to have this removed. Unclear whether this is related to her symptoms tonight.    I personally performed the services described in this documentation, which was scribed in my presence. The recorded information has been reviewed and is accurate.       Warnell Foresterrey Chrystel Barefield, MD 08/07/14 [phone]

## 2014-08-08 LAB — GC/CHLAMYDIA PROBE AMP
CT Probe RNA: NEGATIVE
GC PROBE AMP APTIMA: NEGATIVE

## 2015-09-12 ENCOUNTER — Emergency Department (HOSPITAL_BASED_OUTPATIENT_CLINIC_OR_DEPARTMENT_OTHER)
Admission: EM | Admit: 2015-09-12 | Discharge: 2015-09-13 | Disposition: A | Discharge status: Home / Self Care | Payer: Self-pay | Attending: Emergency Medicine | Admitting: Emergency Medicine

## 2015-09-12 ENCOUNTER — Encounter (HOSPITAL_BASED_OUTPATIENT_CLINIC_OR_DEPARTMENT_OTHER): Payer: Self-pay | Admitting: Emergency Medicine

## 2015-09-12 DIAGNOSIS — Z880 Allergy status to penicillin: Secondary | ICD-10-CM | POA: Insufficient documentation

## 2015-09-12 DIAGNOSIS — J45909 Unspecified asthma, uncomplicated: Secondary | ICD-10-CM | POA: Insufficient documentation

## 2015-09-12 DIAGNOSIS — Z9049 Acquired absence of other specified parts of digestive tract: Secondary | ICD-10-CM | POA: Insufficient documentation

## 2015-09-12 DIAGNOSIS — R5383 Other fatigue: Secondary | ICD-10-CM | POA: Insufficient documentation

## 2015-09-12 DIAGNOSIS — R202 Paresthesia of skin: Secondary | ICD-10-CM | POA: Insufficient documentation

## 2015-09-12 DIAGNOSIS — R51 Headache: Secondary | ICD-10-CM | POA: Insufficient documentation

## 2015-09-12 DIAGNOSIS — H9202 Otalgia, left ear: Secondary | ICD-10-CM | POA: Insufficient documentation

## 2015-09-12 DIAGNOSIS — R109 Unspecified abdominal pain: Secondary | ICD-10-CM | POA: Insufficient documentation

## 2015-09-12 DIAGNOSIS — R42 Dizziness and giddiness: Secondary | ICD-10-CM | POA: Insufficient documentation

## 2015-09-12 DIAGNOSIS — Z3202 Encounter for pregnancy test, result negative: Secondary | ICD-10-CM | POA: Insufficient documentation

## 2015-09-12 DIAGNOSIS — K219 Gastro-esophageal reflux disease without esophagitis: Secondary | ICD-10-CM | POA: Insufficient documentation

## 2015-09-12 DIAGNOSIS — R110 Nausea: Secondary | ICD-10-CM | POA: Insufficient documentation

## 2015-09-12 DIAGNOSIS — R519 Headache, unspecified: Secondary | ICD-10-CM

## 2015-09-12 DIAGNOSIS — R1310 Dysphagia, unspecified: Secondary | ICD-10-CM | POA: Insufficient documentation

## 2015-09-12 DIAGNOSIS — H748X2 Other specified disorders of left middle ear and mastoid: Secondary | ICD-10-CM | POA: Insufficient documentation

## 2015-09-12 DIAGNOSIS — Z79899 Other long term (current) drug therapy: Secondary | ICD-10-CM | POA: Insufficient documentation

## 2015-09-12 LAB — PREGNANCY, URINE: Preg Test, Ur: NEGATIVE

## 2015-09-12 MED ORDER — PROCHLORPERAZINE EDISYLATE 5 MG/ML IJ SOLN
10.0000 mg | Freq: Once | INTRAMUSCULAR | Status: AC
  Administered 2015-09-12: 10 mg via INTRAVENOUS
  Filled 2015-09-12: qty 2

## 2015-09-12 MED ORDER — METHYLPREDNISOLONE SODIUM SUCC 125 MG IJ SOLR
125.0000 mg | Freq: Once | INTRAMUSCULAR | Status: AC
Start: 2015-09-12 — End: 2015-09-12
  Administered 2015-09-12: 125 mg via INTRAVENOUS
  Filled 2015-09-12: qty 2

## 2015-09-12 MED ORDER — DIPHENHYDRAMINE HCL 50 MG/ML IJ SOLN
INTRAMUSCULAR | Status: AC
  Administered 2015-09-12: 25 mg via INTRAVENOUS
  Filled 2015-09-12: qty 1

## 2015-09-12 MED ORDER — DIPHENHYDRAMINE HCL 50 MG/ML IJ SOLN
25.0000 mg | Freq: Once | INTRAMUSCULAR | Status: AC
  Administered 2015-09-12: 25 mg via INTRAVENOUS
  Filled 2015-09-12: qty 1

## 2015-09-12 MED ORDER — SODIUM CHLORIDE 0.9 % IV BOLUS (SEPSIS)
1000.0000 mL | Freq: Once | INTRAVENOUS | Status: AC
  Administered 2015-09-12: 1000 mL via INTRAVENOUS

## 2015-09-12 MED ORDER — DIPHENHYDRAMINE HCL 50 MG/ML IJ SOLN
25.0000 mg | Freq: Once | INTRAMUSCULAR | Status: AC
  Administered 2015-09-12: 25 mg via INTRAVENOUS

## 2015-09-12 MED ORDER — ONDANSETRON 4 MG PO TBDP: ORAL_TABLET | ORAL | Status: DC

## 2015-09-12 MED ORDER — KETOROLAC TROMETHAMINE 30 MG/ML IJ SOLN
30.0000 mg | Freq: Once | INTRAMUSCULAR | Status: AC
  Administered 2015-09-12: 30 mg via INTRAVENOUS
  Filled 2015-09-12: qty 1

## 2015-09-12 MED ORDER — ONDANSETRON 8 MG PO TBDP
8.0000 mg | ORAL_TABLET | Freq: Once | ORAL | Status: AC
  Administered 2015-09-12: 8 mg via ORAL
  Filled 2015-09-12: qty 1

## 2015-09-12 NOTE — ED Notes (Signed)
Pt restless, anxious, "crawling out of her skin", HR 120s, no dyspnea noted, EDP aware, benadryl order received, initiated.

## 2015-09-12 NOTE — ED Notes (Signed)
Pt reports sinus pressure and nausea all day

## 2015-09-12 NOTE — ED Notes (Signed)
Dr. Clayborne DanaMesner into room.

## 2015-09-12 NOTE — ED Notes (Addendum)
Dr. Mesner at BS.  

## 2015-09-12 NOTE — ED Notes (Signed)
C/o facial/ nasal pressure, nausea, dizziness, L earache and possible drainage, and low grade fever, (denies: congestion, cough, sore throat, CP or sob), onset 2d ago. Wants to prevent sinus HA from triggering her migraine HAs. Took ibuprofen 600mg  at 1100.

## 2015-09-12 NOTE — ED Notes (Signed)
Calmer, sleeping, NAD, VSS/ improved.

## 2015-09-12 NOTE — ED Provider Notes (Signed)
CSN: [id number]     Arrival date & time 09/12/15  2008 History  By signing my name below, I, Budd Palmer, attest that this documentation has been prepared under the direction and in the presence of Marily Memos, MD. Electronically Signed: Budd Palmer, ED Scribe. 09/12/2015. 10:10 PM.    Chief Complaint  Patient presents with  . Nausea   The history is provided by the patient. No language interpreter was used.   HPI Comments: Michele Roberts is a 39 y.o. female with a PMHx of GERD and migraines who presents to the Emergency Department complaining of worsening nausea onset 1 day ago. She reports associated lightheadedness, pressure-like HA, loss of appetite, a tingling sensation to the top of her head, decreased urine output (1x today, only a few drops), left ear pain, difficulty swallowing (4 days ago, resolved), fatigue after walking, and abdominal pain. She states this feels worse than her usual migraines. She notes she has tried taking ibuprofen with no relief. She states she has been sipping a Coke during the day today, but has only gotten through about half of it. She reports a PMHx of ear infections as an adult. Pt denies rash, fever, weakness, numbness, v/d, constipation, cough, and SOB. Pt is allergic to amoxicillin.   Past Medical History  Diagnosis Date  . Migraines   . GERD (gastroesophageal reflux disease)   . Migraines   . Asthma    Past Surgical History  Procedure Laterality Date  . Appendectomy    . Cesarean section     Family History  Problem Relation Age of Onset  . Stroke Maternal Grandmother   . Hypertension Neg Hx   . Diabetes Neg Hx   . Cancer Neg Hx    Social History  Substance Use Topics  . Smoking status: Never Smoker   . Smokeless tobacco: None  . Alcohol Use: No   OB History    No data available     Review of Systems  Constitutional: Positive for appetite change and fatigue. Negative for fever.  HENT: Positive for ear pain and trouble  swallowing.   Respiratory: Negative for cough and shortness of breath.   Gastrointestinal: Positive for nausea and abdominal pain. Negative for vomiting, diarrhea and constipation.  Skin: Negative for rash.  Neurological: Positive for light-headedness and headaches. Negative for weakness and numbness.  All other systems reviewed and are negative.   Allergies  Amoxicillin; Banana; Other; Pineapple; and Shrimp  Home Medications   Prior to Admission medications   Medication Sig Start Date End Date Taking? Authorizing Provider  cetirizine (ZYRTEC) 10 MG tablet Take 10 mg by mouth daily.     Yes Historical Provider, MD  montelukast (SINGULAIR) 10 MG tablet Take 10 mg by mouth at bedtime.    Historical Provider, MD  omeprazole (PRILOSEC) 10 MG capsule Take 10 mg by mouth daily.    Historical Provider, MD  ondansetron (ZOFRAN ODT) 4 MG disintegrating tablet  ODT q4 hours prn nausea/vomit 09/12/15   Marily Memos, MD  rizatriptan (MAXALT) 5 MG tablet Take 1 tablet (5 mg total) by mouth as needed. For migraine. May repeat in 2 hours if needed 05/05/14   Waldon Merl, PA-C   BP 106/73 mmHg  Pulse 84  Temp(Src) 98.5 F (36.9 C) (Oral)  Resp 18  SpO2 97% Physical Exam  Constitutional: She is oriented to person, place, and time. She appears well-developed and well-nourished.  HENT:  Head: Normocephalic and atraumatic.  Left middle ear effusion  with slight bulging but no purulent drainage  Eyes: Conjunctivae are normal. Right eye exhibits no discharge. Left eye exhibits no discharge.  Cardiovascular: Normal rate, regular rhythm and normal heart sounds.  Exam reveals no gallop and no friction rub.   No murmur heard. Pulmonary/Chest: Effort normal and breath sounds normal. No respiratory distress. She has no wheezes. She has no rales.  Lungs CTA bilaterally  Abdominal: Soft. Bowel sounds are normal. She exhibits no distension and no mass. There is tenderness. There is no rebound and no  guarding.  Hyperactive bowel sounds  Neurological: She is alert and oriented to person, place, and time. No cranial nerve deficit. Coordination normal.  No altered mental status, able to give full seemingly accurate history.  Face is symmetric, EOM's intact, pupils equal and reactive, vision intact, tongue and uvula midline without deviation Upper and Lower extremity motor 5/5, intact pain perception in distal extremities, 2+ reflexes in biceps, patella and achilles tendons. Finger to nose normal, heel to shin normal. Walks without assistance or evident ataxia.    Skin: Skin is warm and dry. No rash noted. She is not diaphoretic. No erythema.  Psychiatric: She has a normal mood and affect.  Nursing note and vitals reviewed.   ED Course  Procedures  DIAGNOSTIC STUDIES: Oxygen Saturation is 100% on RA, normal by my interpretation.    COORDINATION OF CARE: 10:07 PM - Discussed plans to order a migraine cocktail and antibiotics for possible ear infection. Pt advised of plan for treatment and pt agrees.  Labs Review Labs Reviewed  PREGNANCY, URINE    Imaging Review No results found. I have personally reviewed and evaluated these images and lab results as part of my medical decision-making.   EKG Interpretation None      MDM   Final diagnoses:  Nausea  Nonintractable headache, unspecified chronicity pattern, unspecified headache type   39 year old female here with headache and nausea similar to previous migraines. As have multiple episodes of nausea and decreased by mouth intake today. Neuro exam is normal as documented above. Abdomen benign with slight diffuse tenderness and hyperactive bowel sounds. I doubt that she has any surgical causes for her symptoms likely related to either gastroenteritis or her headache. Treated with a migraine cocktail and had akathisia with that however after Benadryl patient's symptoms improved. Headache improved nausea improved tolerating by mouth and  is stable for discharge. Doubt cva, ich, meningitis or other emergent causes for her symptoms.    I personally performed the services described in this documentation, which was scribed in my presence. The recorded information has been reviewed and is accurate.     Marily MemosJason Hallee Mckenny, MD 09/13/15 [phone]

## 2015-09-13 MED ORDER — AZITHROMYCIN 250 MG PO TABS: 250.0000 mg | ORAL_TABLET | Freq: Every day | ORAL | Status: DC

## 2016-02-22 ENCOUNTER — Encounter: Payer: Self-pay | Admitting: Pediatrics

## 2016-03-07 ENCOUNTER — Emergency Department (HOSPITAL_BASED_OUTPATIENT_CLINIC_OR_DEPARTMENT_OTHER): Payer: Commercial Managed Care - PPO

## 2016-03-07 ENCOUNTER — Emergency Department (HOSPITAL_BASED_OUTPATIENT_CLINIC_OR_DEPARTMENT_OTHER)
Admission: EM | Admit: 2016-03-07 | Discharge: 2016-03-07 | Disposition: A | Discharge status: Home / Self Care | Payer: Commercial Managed Care - PPO | Attending: Emergency Medicine | Admitting: Emergency Medicine

## 2016-03-07 ENCOUNTER — Encounter (HOSPITAL_BASED_OUTPATIENT_CLINIC_OR_DEPARTMENT_OTHER): Payer: Self-pay

## 2016-03-07 DIAGNOSIS — J45909 Unspecified asthma, uncomplicated: Secondary | ICD-10-CM | POA: Insufficient documentation

## 2016-03-07 DIAGNOSIS — M549 Dorsalgia, unspecified: Secondary | ICD-10-CM | POA: Diagnosis not present

## 2016-03-07 DIAGNOSIS — R1011 Right upper quadrant pain: Secondary | ICD-10-CM | POA: Insufficient documentation

## 2016-03-07 DIAGNOSIS — K2970 Gastritis, unspecified, without bleeding: Secondary | ICD-10-CM | POA: Diagnosis not present

## 2016-03-07 DIAGNOSIS — R079 Chest pain, unspecified: Secondary | ICD-10-CM | POA: Diagnosis present

## 2016-03-07 LAB — COMPREHENSIVE METABOLIC PANEL
ALBUMIN: 4.2 g/dL (ref 3.5–5.0)
ALT: 36 U/L (ref 14–54)
AST: 31 U/L (ref 15–41)
Alkaline Phosphatase: 52 U/L (ref 38–126)
Anion gap: 9 (ref 5–15)
BUN: 7 mg/dL (ref 6–20)
CO2: 24 mmol/L (ref 22–32)
Calcium: 9.1 mg/dL (ref 8.9–10.3)
Chloride: 104 mmol/L (ref 101–111)
Creatinine, Ser: 0.6 mg/dL (ref 0.44–1.00)
GFR calc Af Amer: >60 mL/min (ref >60)
GLUCOSE: 106 mg/dL — AB (ref 65–99)
POTASSIUM: 3.6 mmol/L (ref 3.5–5.1)
Sodium: 137 mmol/L (ref 135–145)
Total Bilirubin: 0.8 mg/dL (ref 0.3–1.2)
Total Protein: 7.5 g/dL (ref 6.5–8.1)

## 2016-03-07 LAB — LIPASE, BLOOD: LIPASE: 18 U/L (ref 11–51)

## 2016-03-07 LAB — CBC WITH DIFFERENTIAL/PLATELET
BASOS ABS: 0 10*3/uL (ref 0.0–0.1)
Basophils Relative: 0 %
Eosinophils Absolute: 0 10*3/uL (ref 0.0–0.7)
Eosinophils Relative: 0 %
HCT: 38.3 % (ref 36.0–46.0)
HEMOGLOBIN: 12.4 g/dL (ref 12.0–15.0)
LYMPHS PCT: 6 %
Lymphs Abs: 1.2 10*3/uL (ref 0.7–4.0)
MCH: 27.7 pg (ref 26.0–34.0)
MCHC: 32.4 g/dL (ref 30.0–36.0)
MCV: 85.7 fL (ref 78.0–100.0)
MONO ABS: 1.3 10*3/uL — AB (ref 0.1–1.0)
Monocytes Relative: 6 %
NEUTROS ABS: 17.1 10*3/uL — AB (ref 1.7–7.7)
Neutrophils Relative %: 88 %
Platelets: 330 10*3/uL (ref 150–400)
RBC: 4.47 MIL/uL (ref 3.87–5.11)
RDW: 12.7 % (ref 11.5–15.5)
WBC: 19.5 10*3/uL — ABNORMAL HIGH (ref 4.0–10.5)

## 2016-03-07 LAB — URINALYSIS, ROUTINE W REFLEX MICROSCOPIC
Bilirubin Urine: NEGATIVE
Glucose, UA: NEGATIVE mg/dL
Hgb urine dipstick: NEGATIVE
Ketones, ur: 15 mg/dL — AB
LEUKOCYTES UA: NEGATIVE
Nitrite: NEGATIVE
PH: 5.5 (ref 5.0–8.0)
Protein, ur: NEGATIVE mg/dL
Specific Gravity, Urine: 1.027 (ref 1.005–1.030)

## 2016-03-07 LAB — PREGNANCY, URINE: PREG TEST UR: NEGATIVE

## 2016-03-07 LAB — TROPONIN I: Troponin I: <0.03 ng/mL (ref <0.031)

## 2016-03-07 MED ORDER — SUCRALFATE 1 G PO TABS: 1.0000 g | ORAL_TABLET | Freq: Four times a day (QID) | ORAL | Status: DC | PRN

## 2016-03-07 MED ORDER — ONDANSETRON HCL 4 MG/2ML IJ SOLN
4.0000 mg | Freq: Once | INTRAMUSCULAR | Status: AC
  Administered 2016-03-07: 4 mg via INTRAVENOUS
  Filled 2016-03-07: qty 2

## 2016-03-07 MED ORDER — PANTOPRAZOLE SODIUM 40 MG IV SOLR
40.0000 mg | Freq: Once | INTRAVENOUS | Status: AC
  Administered 2016-03-07: 40 mg via INTRAVENOUS
  Filled 2016-03-07: qty 40

## 2016-03-07 MED ORDER — SODIUM CHLORIDE 0.9 % IV SOLN: INTRAVENOUS | Status: DC

## 2016-03-07 MED ORDER — PANTOPRAZOLE SODIUM 20 MG PO TBEC: 20.0000 mg | DELAYED_RELEASE_TABLET | Freq: Every day | ORAL | Status: DC

## 2016-03-07 MED ORDER — HYDROCODONE-ACETAMINOPHEN 5-325 MG PO TABS: 1.0000 | ORAL_TABLET | ORAL | Status: DC | PRN

## 2016-03-07 MED ORDER — HYDROMORPHONE HCL 1 MG/ML IJ SOLN
0.5000 mg | INTRAMUSCULAR | Status: DC | PRN
  Administered 2016-03-07 (×2): 0.5 mg via INTRAVENOUS
  Filled 2016-03-07 (×2): qty 1

## 2016-03-07 MED ORDER — SODIUM CHLORIDE 0.9 % IV BOLUS (SEPSIS)
1000.0000 mL | Freq: Once | INTRAVENOUS | Status: AC
  Administered 2016-03-07: 1000 mL via INTRAVENOUS

## 2016-03-07 MED ORDER — ONDANSETRON HCL 4 MG PO TABS: 4.0000 mg | ORAL_TABLET | Freq: Four times a day (QID) | ORAL | Status: DC

## 2016-03-07 MED ORDER — IOPAMIDOL (ISOVUE-300) INJECTION 61%
100.0000 mL | Freq: Once | INTRAVENOUS | Status: AC | PRN
  Administered 2016-03-07: 100 mL via INTRAVENOUS

## 2016-03-07 NOTE — ED Notes (Signed)
MD at bedside. 

## 2016-03-07 NOTE — ED Notes (Signed)
Pt alert, NAD, calm, interactive, no dyspnea noted[facility]

## 2016-03-07 NOTE — Discharge Instructions (Signed)

## 2016-03-07 NOTE — ED Provider Notes (Signed)
CSN: [id number]     Arrival date & time 03/07/16  1558 History  By signing my name below, I, Doreatha MartinEva Mathews, attest that this documentation has been prepared under the direction and in the presence of Linwood DibblesJon Ferdie Bakken, MD. Electronically Signed: Doreatha MartinEva Mathews, ED Scribe. 03/07/2016. 4:32 PM.    Chief Complaint  Patient presents with  . Chest Pain   The history is provided by the patient. No language interpreter was used.   HPI Comments: Michele Roberts is a 40 y.o. female with h/o GERD who presents to the Emergency Department complaining of moderate, substernal CP with radiation to the upper back onset this afternoon with associated periumbilical abdominal pain. Pt also complains of HA, sinus pressure, generalized myalgias, fatigue, nausea and chills for 3 days. She reports decreased appetite and thirst with her current symptoms. Pt states her abdominal pain is worsened post-prandial. She reports significant relief of her HA and sinus pressure with Sudafed and Excedrin. H/o appendectomy, cesarean section. No recent tick bites. No recent travel outside the country. She denies diarrhea, emesis, dysuria, vaginal discharge or bleeding, known fever.  Past Medical History  Diagnosis Date  . Migraines   . GERD (gastroesophageal reflux disease)   . Migraines   . Asthma    Past Surgical History  Procedure Laterality Date  . Appendectomy    . Cesarean section     Family History  Problem Relation Age of Onset  . Stroke Maternal Grandmother   . Hypertension Neg Hx   . Diabetes Neg Hx   . Cancer Neg Hx    Social History  Substance Use Topics  . Smoking status: Never Smoker   . Smokeless tobacco: None  . Alcohol Use: No   OB History    No data available     Review of Systems  Constitutional: Positive for chills and fatigue. Negative for fever.  HENT: Positive for sinus pressure.   Cardiovascular: Positive for chest pain.  Gastrointestinal: Positive for nausea and abdominal pain. Negative for  vomiting and diarrhea.  Genitourinary: Negative for dysuria, vaginal bleeding and vaginal discharge.  Musculoskeletal: Positive for myalgias (generalized) and back pain.  Neurological: Positive for headaches.  All other systems reviewed and are negative.  Allergies  Amoxicillin; Banana; Latex; Other; Pineapple; and Shrimp  Home Medications   Prior to Admission medications   Medication Sig Start Date End Date Taking? Authorizing Provider  cetirizine (ZYRTEC) 10 MG tablet Take 10 mg by mouth daily.      Historical Provider, MD  HYDROcodone-acetaminophen (NORCO/VICODIN) 5-325 MG tablet Take 1 tablet by mouth every 4 (four) hours as needed. 03/07/16   Linwood DibblesJon Micha Erck, MD  ondansetron (ZOFRAN ODT) 4 MG disintegrating tablet 4mg  ODT q4 hours prn nausea/vomit 09/12/15   Marily MemosJason Mesner, MD  ondansetron (ZOFRAN) 4 MG tablet Take 1 tablet (4 mg total) by mouth every 6 (six) hours. 03/07/16   Linwood DibblesJon Brandilynn Taormina, MD  pantoprazole (PROTONIX) 20 MG tablet Take 1 tablet (20 mg total) by mouth daily. 03/07/16   Linwood DibblesJon Zenas Santa, MD  rizatriptan (MAXALT) 5 MG tablet Take 1 tablet (5 mg total) by mouth as needed. For migraine. May repeat in 2 hours if needed 05/05/14   Waldon MerlWilliam C Martin, PA-C  sucralfate (CARAFATE) 1 g tablet Take 1 tablet (1 g total) by mouth 4 (four) times daily as needed (acid irritation). 03/07/16   Linwood DibblesJon Griffith Santilli, MD   BP 121/73 mmHg  Pulse 93  Temp(Src) 99.6 F (37.6 C) (Oral)  Resp 18  Ht 4'  11" (1.499 m)  Wt 72.576 kg  BMI 32.30 kg/m2  SpO2 99%  LMP  Physical Exam  Constitutional: She appears well-developed and well-nourished.  Appears uncomfortable.   HENT:  Head: Normocephalic and atraumatic.  Right Ear: External ear normal.  Left Ear: External ear normal.  Eyes: Conjunctivae are normal. Right eye exhibits no discharge. Left eye exhibits no discharge. No scleral icterus.  Neck: Neck supple. No tracheal deviation present.  Cardiovascular: Normal rate, regular rhythm and intact distal pulses.    Pulmonary/Chest: Effort normal and breath sounds normal. No stridor. No respiratory distress. She has no wheezes. She has no rales.  Abdominal: Soft. Bowel sounds are normal. She exhibits no distension and no mass. There is tenderness in the right upper quadrant and epigastric area. There is guarding. There is no rigidity and no rebound. No hernia.  Musculoskeletal: She exhibits no edema or tenderness.  Neurological: She is alert. She has normal strength. No cranial nerve deficit (no facial droop, extraocular movements intact, no slurred speech) or sensory deficit. She exhibits normal muscle tone. She displays no seizure activity. Coordination normal.  Skin: Skin is warm and dry. No rash noted.  Psychiatric: She has a normal mood and affect.  Nursing note and vitals reviewed.   ED Course  Procedures (including critical care time) DIAGNOSTIC STUDIES: Oxygen Saturation is 100% on RA, normal by my interpretation.    COORDINATION OF CARE: 4:28 PM Discussed treatment plan with pt at bedside which includes lab work and pt agreed to plan.  10:06 AM [place] not definitive for acute cholecystitis. PT still has ttp in the ruq.  Will ct to evaluate further  Labs Review Labs Reviewed  COMPREHENSIVE METABOLIC PANEL - Abnormal; Notable for the following:    Glucose, Bld 106 (*)    All other components within normal limits  CBC WITH DIFFERENTIAL/PLATELET - Abnormal; Notable for the following:    WBC 19.5 (*)    Neutro Abs 17.1 (*)    Monocytes Absolute 1.3 (*)    All other components within normal limits  URINALYSIS, ROUTINE W REFLEX MICROSCOPIC (NOT AT [facility]) - Abnormal; Notable for the following:    Color, Urine AMBER (*)    Ketones, ur 15 (*)    All other components within normal limits  LIPASE, BLOOD  TROPONIN I  PREGNANCY, URINE    Imaging Review US Abdomen Complete  03/07/2016  CLINICAL DATA:  40 year old female with substernal chest pain extending to back. Periumbilical pain. Nausea and  chills for 3 days. Decreased appetite. Symptoms worse after eating. Initial encounter. EXAM: ABDOMEN ULTRASOUND COMPLETE COMPARISON:  08/06/2014 CT.  Report from 07/09/2004 ultrasound. FINDINGS: Gallbladder: No gallstones, gallbladder wall thickening or pericholecystic fluid. Minimal sludge. Common bile duct: Diameter: 3.3 mm. Distal aspect not visualized secondary to bowel gas. Liver: Increased echogenicity consistent with fatty infiltration without focal abnormality. IVC: No abnormality visualized. Pancreas: Evaluation limited by bowel gas. Portions visualized unremarkable. Spleen: Size and appearance within normal limits. Right Kidney: Length: 11.5 cm. Echogenicity within normal limits. No mass or hydronephrosis visualized. Left Kidney: Length: 12.9 cm. Echogenicity within normal limits. No mass or hydronephrosis visualized. Abdominal aorta: No aneurysm visualized. Other findings: None. IMPRESSION: Minimal gallbladder sludge may be present however no gallstones or ultrasound findings to suggest gallbladder inflammation. Fatty liver. Suboptimal evaluation of the pancreas secondary to bowel gas. Portions visualized appear unremarkable. Electronically Signed   By: Lacy Duverney M.D.   On: 03/07/2016 18:45   Ct Abdomen Pelvis W Contrast  03/07/2016  CLINICAL DATA:  Periumbilical abdominal pain for 10 hours. Fatigue. Nausea and chills. Upper abdominal pain. EXAM: CT ABDOMEN AND PELVIS WITH CONTRAST TECHNIQUE: Multidetector CT imaging of the abdomen and pelvis was performed using the standard protocol following bolus administration of intravenous contrast. CONTRAST:  ISOVUE-300 IOPAMIDOL (ISOVUE-300) INJECTION 61% COMPARISON:  03/07/2016 and 08/06/2014 FINDINGS: Lower chest:  Borderline cardiomegaly. Hepatobiliary: Diffuse hepatic steatosis. Pancreas: Unremarkable Spleen: Unremarkable Adrenals/Urinary Tract: Unremarkable Stomach/Bowel: There is some mild wall thickening along the posterior gastric body and  antrum inferiorly, for example on image 59/6, which could be a manifestation of gastritis. Vascular/Lymphatic: Unremarkable Reproductive: IUD well positioned in the uterus. Adnexa unremarkable in contour. Other: No supplemental non-categorized findings. Musculoskeletal: Lower thoracic spondylosis. Lower lumbar spondylosis and degenerative disc disease with suspected mild foraminal impingement at the L5-S1 level. IMPRESSION: 1. Mild wall thickening along the posterior-inferior gastric body and antrum may be a manifestation of gastritis. 2. Lumbar spondylosis and degenerative disc disease, causing foraminal impingement at the L5-S1 level. 3. Borderline cardiomegaly. 4. Diffuse hepatic steatosis. Electronically Signed   By: Gaylyn Rong M.D.   On: 03/07/2016 20:58   Dg Abd Acute W/chest  03/07/2016  CLINICAL DATA:  Chest and abdominal pain EXAM: DG ABDOMEN ACUTE W/ 1V CHEST COMPARISON:  CT abdomen 08/06/2014.  Chest x-ray 08/20/2012 FINDINGS: Cardiac enlargement is stable. Negative for heart failure. Lungs are clear without infiltrate or effusion. No mass lesion. Normal bowel gas pattern. Negative for bowel obstruction. Negative for pneumoperitoneum. IUD noted in satisfactory position in the pelvis. No renal calculi. No skeletal abnormality. IMPRESSION: Negative abdominal radiographs.  No acute cardiopulmonary disease. Electronically Signed   By: Marlan Palau M.D.   On: 03/07/2016 17:19   I have personally reviewed and evaluated these images and lab results as part of my medical decision-making.   EKG Interpretation   Date/Time:  Monday Mar 07 2016 16:05:55 EDT Ventricular Rate:  79 PR Interval:  144 QRS Duration: 70 QT Interval:  360 QTC Calculation: 412 R Axis:   21 Text Interpretation:  Normal sinus rhythm Low voltage QRS , new since last  tracing Cannot rule out Anterior infarct , age undetermined ,  Abnormal  ECG Confirmed by Brithany Whitworth  MD-J, Luddie Boghosian ([id number]) on 03/07/2016 4:10:22 PM      MDM    Final diagnoses:  Gastritis   Pt noted to have elevated WBC in ttp in the epigastric and ruq region.  Chest pain likely referred from GI source.  Doubt acute cardiac, vascular or pulmonary illness. [place] without findings to suggest acute cholecystitis.  CT scan shows some inflammation suggesting gastritis.  Sx may be related to gastritis , gerd.   DC home with antacids.   GI PCP follow up.    I personally performed the services described in this documentation, which was scribed in my presence.  The recorded information has been reviewed and is accurate.    Linwood Dibbles, MD 03/09/16 1009

## 2016-03-07 NOTE — ED Notes (Addendum)
CP since 1030am-NAD-steady gait-states she had HA with head congestion yesterday-abd pain with decreased appetite x 2-3 days

## 2016-03-07 NOTE — ED Notes (Signed)
Pt not in room, pt in radiology.  

## 2016-10-19 ENCOUNTER — Telehealth: Payer: Self-pay | Admitting: Family Medicine

## 2016-10-19 NOTE — Telephone Encounter (Signed)
Ok per PCP to switch providers.

## 2016-10-19 NOTE — Telephone Encounter (Signed)
ok 

## 2016-10-19 NOTE — Telephone Encounter (Signed)
Pt called in, she says that she would like to switch providers from Dr. Beverely Lowabori to Dr. Patsy Lageropland. Pt says that traveling to [place] is to far for her.   Pt says that she also need her CPE.  Is this switch okay?

## 2016-10-24 NOTE — Telephone Encounter (Signed)
LVM for pt to call us back for scheduling.

## 2016-10-27 ENCOUNTER — Emergency Department (HOSPITAL_BASED_OUTPATIENT_CLINIC_OR_DEPARTMENT_OTHER)
Admission: EM | Admit: 2016-10-27 | Discharge: 2016-10-27 | Disposition: A | Discharge status: Home / Self Care | Payer: Commercial Managed Care - PPO | Attending: Emergency Medicine | Admitting: Emergency Medicine

## 2016-10-27 ENCOUNTER — Encounter (HOSPITAL_BASED_OUTPATIENT_CLINIC_OR_DEPARTMENT_OTHER): Payer: Self-pay

## 2016-10-27 DIAGNOSIS — R112 Nausea with vomiting, unspecified: Secondary | ICD-10-CM | POA: Diagnosis present

## 2016-10-27 DIAGNOSIS — J45909 Unspecified asthma, uncomplicated: Secondary | ICD-10-CM | POA: Diagnosis not present

## 2016-10-27 DIAGNOSIS — K529 Noninfective gastroenteritis and colitis, unspecified: Secondary | ICD-10-CM

## 2016-10-27 LAB — COMPREHENSIVE METABOLIC PANEL
ALK PHOS: 46 U/L (ref 38–126)
ALT: 24 U/L (ref 14–54)
AST: 19 U/L (ref 15–41)
Albumin: 3.8 g/dL (ref 3.5–5.0)
Anion gap: 8 (ref 5–15)
BUN: 10 mg/dL (ref 6–20)
CALCIUM: 9 mg/dL (ref 8.9–10.3)
CHLORIDE: 104 mmol/L (ref 101–111)
CO2: 26 mmol/L (ref 22–32)
CREATININE: 0.64 mg/dL (ref 0.44–1.00)
GFR calc Af Amer: >60 mL/min (ref >60)
GFR calc non Af Amer: >60 mL/min (ref >60)
GLUCOSE: 97 mg/dL (ref 65–99)
Potassium: 3.9 mmol/L (ref 3.5–5.1)
SODIUM: 138 mmol/L (ref 135–145)
Total Bilirubin: 1 mg/dL (ref 0.3–1.2)
Total Protein: 7.2 g/dL (ref 6.5–8.1)

## 2016-10-27 LAB — URINALYSIS, ROUTINE W REFLEX MICROSCOPIC
Glucose, UA: NEGATIVE mg/dL
Hgb urine dipstick: NEGATIVE
Ketones, ur: >80 mg/dL — AB
Leukocytes, UA: NEGATIVE
NITRITE: NEGATIVE
PH: 6 (ref 5.0–8.0)
Protein, ur: NEGATIVE mg/dL
SPECIFIC GRAVITY, URINE: 1.024 (ref 1.005–1.030)

## 2016-10-27 LAB — CBC
HCT: 40.5 % (ref 36.0–46.0)
Hemoglobin: 12.8 g/dL (ref 12.0–15.0)
MCH: 27.6 pg (ref 26.0–34.0)
MCHC: 31.6 g/dL (ref 30.0–36.0)
MCV: 87.3 fL (ref 78.0–100.0)
PLATELETS: 341 10*3/uL (ref 150–400)
RBC: 4.64 MIL/uL (ref 3.87–5.11)
RDW: 12.8 % (ref 11.5–15.5)
WBC: 10 10*3/uL (ref 4.0–10.5)

## 2016-10-27 LAB — PREGNANCY, URINE: Preg Test, Ur: NEGATIVE

## 2016-10-27 LAB — LIPASE, BLOOD: LIPASE: 19 U/L (ref 11–51)

## 2016-10-27 MED ORDER — ONDANSETRON HCL 4 MG/2ML IJ SOLN
4.0000 mg | Freq: Once | INTRAMUSCULAR | Status: AC | PRN
  Administered 2016-10-27: 4 mg via INTRAVENOUS
  Filled 2016-10-27: qty 2

## 2016-10-27 MED ORDER — KETOROLAC TROMETHAMINE 30 MG/ML IJ SOLN
30.0000 mg | Freq: Once | INTRAMUSCULAR | Status: AC
  Administered 2016-10-27: 30 mg via INTRAVENOUS
  Filled 2016-10-27: qty 1

## 2016-10-27 MED ORDER — SODIUM CHLORIDE 0.9 % IV BOLUS (SEPSIS)
1000.0000 mL | Freq: Once | INTRAVENOUS | Status: AC
  Administered 2016-10-27: 1000 mL via INTRAVENOUS

## 2016-10-27 MED ORDER — ONDANSETRON 8 MG PO TBDP: ORAL_TABLET | ORAL | 0 refills | Status: DC

## 2016-10-27 MED ORDER — PROMETHAZINE HCL 25 MG/ML IJ SOLN
12.5000 mg | Freq: Once | INTRAMUSCULAR | Status: DC
  Filled 2016-10-27: qty 1

## 2016-10-27 MED FILL — ONDANSETRON ODT 8 MG TABLET: 8 | 2 days supply | Qty: 6 | Fill #0

## 2016-10-27 NOTE — Discharge Instructions (Signed)
Zofran as prescribed as needed for nausea. ° °Clear liquid diet for the next 12 hours, then slowly advance to normal. ° °Return to the emergency department if you develop worsening pain, high fevers, bloody stools, or other new and concerning symptoms. °

## 2016-10-27 NOTE — ED Triage Notes (Signed)
C/o n/v/d, fever x 3 days-NAD-steady gait

## 2016-10-27 NOTE — ED Notes (Signed)
Pt also reports HA and generalized body aches

## 2016-10-27 NOTE — ED Provider Notes (Signed)
[facility]   CSN: [id number] Arrival date & time: 10/27/16  1244     History   Chief Complaint Chief Complaint  Patient presents with  . Emesis    HPI Michele Roberts is a 41 y.o. female.  Patient is a 41 year old female with past medical history of migraines, reflux, asthma. She presents for evaluation of nausea, vomiting, diarrhea, fevers, and body aches that started 2 days ago. She reports not being able to keep anything down. She reports ill contacts at home. All of her vomiting and diarrhea have been nonbloody and nonbilious.      Past Medical History:  Diagnosis Date  . Asthma   . GERD (gastroesophageal reflux disease)   . Migraines   . Migraines     Patient Active Problem List   Diagnosis Date Noted  . Acute maxillary sinusitis 05/05/2014  . Otitis media 05/05/2014  . Dysuria 09/26/2012  . Possible exposure to STD 09/26/2012  . Acute sinusitis 08/24/2012  . Cough 12/06/2011  . IBS (irritable bowel syndrome) 08/29/2011  . General medical examination 08/29/2011  . ADJUSTMENT DISORDER WITH DEPRESSED MOOD 08/26/2010  . ABNORMAL BREATH SOUNDS 08/26/2010  . HELICOBACTER PYLORI INFECTION 07/21/2010  . MIGRAINE, MENSTRUAL 07/21/2010  . GERD 07/21/2010  . THYROID FUNCTION TEST, ABNORMAL 07/21/2010  . ALLERGIC REACTION 07/21/2010    Past Surgical History:  Procedure Laterality Date  . APPENDECTOMY    . CESAREAN SECTION      OB History    No data available       Home Medications    Prior to Admission medications   Not on File    Family History Family History  Problem Relation Age of Onset  . Stroke Maternal Grandmother   . Hypertension Neg Hx   . Diabetes Neg Hx   . Cancer Neg Hx     Social History Social History  Substance Use Topics  . Smoking status: Never Smoker  . Smokeless tobacco: Never Used  . Alcohol use No     Allergies   Amoxicillin; Banana; Latex; Other; Pineapple; and Shrimp [shellfish  allergy]   Review of Systems Review of Systems  All other systems reviewed and are negative.    Physical Exam Updated Vital Signs BP 112/76   Pulse 69   Temp 99.1 F (37.3 C) (Oral)   Resp 20   SpO2 100%   Physical Exam  Constitutional: She is oriented to person, place, and time. She appears well-developed and well-nourished. No distress.  HENT:  Head: Normocephalic and atraumatic.  Mouth/Throat: Oropharynx is clear and moist.  Neck: Normal range of motion. Neck supple.  Cardiovascular: Normal rate and regular rhythm.  Exam reveals no gallop and no friction rub.   No murmur heard. Pulmonary/Chest: Effort normal and breath sounds normal. No respiratory distress. She has no wheezes.  Abdominal: Soft. Bowel sounds are normal. She exhibits no distension. There is no tenderness.  Musculoskeletal: Normal range of motion.  Neurological: She is alert and oriented to person, place, and time.  Skin: Skin is warm and dry. She is not diaphoretic.  Nursing note and vitals reviewed.    ED Treatments / Results  Labs (all labs ordered are listed, but only abnormal results are displayed) Labs Reviewed  URINALYSIS, ROUTINE W REFLEX MICROSCOPIC - Abnormal; Notable for the following:       Result Value   APPearance CLOUDY (*)    Bilirubin Urine SMALL (*)    Ketones, ur >80 (*)  All other components within normal limits  PREGNANCY, URINE  LIPASE, BLOOD  COMPREHENSIVE METABOLIC PANEL  CBC    EKG  EKG Interpretation None       Radiology No results found.  Procedures Procedures (including critical care time)  Medications Ordered in ED Medications  promethazine (PHENERGAN) injection 12.5 mg (not administered)  ondansetron (ZOFRAN) injection 4 mg (4 mg Intravenous Given 10/27/16 1358)  sodium chloride 0.9 % bolus 1,000 mL (1,000 mLs Intravenous New Bag/Given 10/27/16 1400)  ketorolac (TORADOL) 30 MG/ML injection 30 mg (30 mg Intravenous Given 10/27/16 1423)     Initial  Impression / Assessment and Plan / ED Course  I have reviewed the triage vital signs and the nursing notes.  Pertinent labs & imaging results that were available during my care of the patient were reviewed by me and considered in my medical decision making (see chart for details).     Patient given Toradol, Zofran, and Phenergan as well as IV fluids. She is feeling somewhat better. Her vital signs and laboratory studies are reassuring. She will be discharged with Zofran and when necessary return.  Her presentation, exam, and workup are most consistent with a viral gastroenteritis. Her abdomen is benign.  Final Clinical Impressions(s) / ED Diagnoses   Final diagnoses:  None    New Prescriptions New Prescriptions   No medications on file     Geoffery Lyons, MD 10/27/16 1514

## 2017-01-11 ENCOUNTER — Encounter: Payer: Commercial Managed Care - PPO | Admitting: Family Medicine

## 2017-02-28 ENCOUNTER — Telehealth: Payer: Self-pay | Admitting: Behavioral Health

## 2017-02-28 NOTE — Telephone Encounter (Signed)
Unable to reach patient at time of Pre-Visit Call.  Left message for patient to return call when available.    

## 2017-03-01 ENCOUNTER — Encounter: Payer: Self-pay | Admitting: Gastroenterology

## 2017-03-01 ENCOUNTER — Ambulatory Visit (INDEPENDENT_AMBULATORY_CARE_PROVIDER_SITE_OTHER): Payer: Commercial Managed Care - PPO | Admitting: Family Medicine

## 2017-03-01 ENCOUNTER — Encounter: Payer: Self-pay | Admitting: Family Medicine

## 2017-03-01 VITALS — BP 120/70 | HR 95 | Temp 99.1°F | Ht 58.25 in | Wt 161.0 lb

## 2017-03-01 DIAGNOSIS — Z13220 Encounter for screening for lipoid disorders: Secondary | ICD-10-CM

## 2017-03-01 DIAGNOSIS — Z131 Encounter for screening for diabetes mellitus: Secondary | ICD-10-CM

## 2017-03-01 DIAGNOSIS — J3089 Other allergic rhinitis: Secondary | ICD-10-CM

## 2017-03-01 DIAGNOSIS — G479 Sleep disorder, unspecified: Secondary | ICD-10-CM | POA: Diagnosis not present

## 2017-03-01 DIAGNOSIS — Z1329 Encounter for screening for other suspected endocrine disorder: Secondary | ICD-10-CM | POA: Diagnosis not present

## 2017-03-01 DIAGNOSIS — K589 Irritable bowel syndrome without diarrhea: Secondary | ICD-10-CM

## 2017-03-01 LAB — LIPID PANEL
CHOL/HDL RATIO: 3
Cholesterol: 168 mg/dL (ref 0–200)
HDL: 49.7 mg/dL (ref >39.00)
LDL CALC: 106 mg/dL — AB (ref 0–99)
NONHDL: 118.37
TRIGLYCERIDES: 60 mg/dL (ref 0.0–149.0)
VLDL: 12 mg/dL (ref 0.0–40.0)

## 2017-03-01 LAB — HEMOGLOBIN A1C: Hgb A1c MFr Bld: 5.9 % (ref 4.6–6.5)

## 2017-03-01 LAB — TSH: TSH: 1.65 u[IU]/mL (ref 0.35–4.50)

## 2017-03-01 MED ORDER — MONTELUKAST SODIUM 10 MG PO TABS: 10.0000 mg | ORAL_TABLET | Freq: Every day | ORAL | 6 refills | Status: DC

## 2017-03-01 MED ORDER — TRAZODONE HCL 50 MG PO TABS
25.0000 mg | ORAL_TABLET | Freq: Every evening | ORAL | 6 refills | Status: DC | PRN
Start: 2017-03-01 — End: 2018-10-17

## 2017-03-01 NOTE — Patient Instructions (Addendum)
It was nice to see you today- I will be in touch with your labs asap Let's have you try trazodone for sleep- take this as needed before bed We will also try some singulair for your allergies.  Please keep me posted regarding these 2 issues; if need be we can add a medication for mild depression or have you see an allergist I am also going to have you see a GI specialist about your stomach concerns

## 2017-03-01 NOTE — Progress Notes (Addendum)
[facility] [address], [address] [place], [place] [address] [phone] [phone]  Date:  03/01/2017   Name:  Michele NewnessMarilin P Roberts   DOB:  06/05/1976   MRN:  [id number]  PCP:  Pearline Cablesopland, Madilynn Montante C, MD    Chief Complaint: Annual Exam (Former pt of Clioody Martin. Due for CPE. Last PAP Feb 2018 and Last tetanus unknown but within last 10 yrs)   History of Present Illness:  Michele Roberts is a 41 y.o. very pleasant female patient who presents with the following:  Former pt of Dr. Beverely Lowabori, although she has not been seen by her for a couple of years. [facility] Here today seeking a CPE She does have a GYN doctor who does her pap screening.  She sees [facility] 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  Between work and school and her children (she is a single mom) things are quite busy for her, and can feel overwhelming Her daughters are 1723, 8716, and 43 yo She also has her nephew living with him right now- he will be graduating HS next year.    She does have IBS She had an abnl thyroid test a while ago No recent chl or A1c test- she did eat this am, she had coffee and [employer].    She notes a long history of sensitive stomach since her oldest was born.  She would like to have a GI evaluation  She was negative for H pylori in 2011 She [facility].    She notes that due to her very busy schedule she sometimes cannot sleep- she has tried melatonin which helps some but still sometimes her mind is  racing too much.   She does not feel that she is really depressed per se.  Certainly no SI.  She would be interested in trying something like trazodone.    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  She started to have an allergy evaluation a couple of years ago but did not finish with this  She is using zyrtec and flonase. She also tried some allegra D, she uses a nasal flush daily    Patient Active Problem List   Diagnosis Date Noted  . Acute maxillary sinusitis 05/05/2014  . Otitis media 05/05/2014  . Dysuria 09/26/2012  . Possible exposure to STD 09/26/2012  . Acute sinusitis 08/24/2012  . Cough 12/06/2011  . IBS (irritable bowel syndrome) 08/29/2011  . General medical examination 08/29/2011  . ADJUSTMENT DISORDER WITH DEPRESSED MOOD 08/26/2010  . ABNORMAL BREATH SOUNDS 08/26/2010  . HELICOBACTER PYLORI INFECTION 07/21/2010  . MIGRAINE, MENSTRUAL 07/21/2010  . GERD 07/21/2010  . THYROID FUNCTION TEST, ABNORMAL 07/21/2010  . ALLERGIC REACTION 07/21/2010    Past Medical History:  Diagnosis Date  . Asthma   . GERD (gastroesophageal reflux disease)   . Migraines   . Migraines  Past Surgical History:  Procedure Laterality Date  . APPENDECTOMY    . CESAREAN SECTION      Social History  Substance Use Topics  . Smoking status: Never Smoker  . Smokeless tobacco: Never Used  . Alcohol use No    Family History  Problem Relation Age of Onset  . Stroke Maternal Grandmother   . Hypertension Neg Hx   . Diabetes Neg Hx   . Cancer Neg Hx     Allergies  Allergen Reactions  . Amoxicillin     Burns skin  . Banana     Throat itches  . Latex     Burns skin  . Other     Dust Mites; Dogs, Cats  . Pineapple     Throat itches  . Shrimp [Shellfish Allergy]     Medication list has been reviewed and updated.  No current outpatient prescriptions on file prior to visit.   No current facility-administered medications on file prior to visit.     Review of  Systems:  As per HPI- otherwise negative. No fever, chills, CP, SOB, rash   Physical Examination: Vitals:   03/01/17 0937  BP: 120/70  Pulse: 95  Temp: 99.1 F (37.3 C)   Vitals:   03/01/17 0937  Weight: 161 lb (73 kg)  Height: 4' 10.25" (1.48 m)   Body mass index is 33.36 kg/m. Ideal Body Weight: Weight in (lb) to have BMI = 25: 120.4  GEN: WDWN, NAD, Non-toxic, A & O x 3, obese, otherwise looks well HEENT: Atraumatic, Normocephalic. Neck supple. No masses, No LAD.  Bilateral TM wnl, oropharynx normal.  PEERL,EOMI.   Nasal cavity is inflamed  Ears and Nose: No external deformity. CV: RRR, No M/G/R. No JVD. No thrill. No extra heart sounds. PULM: CTA B, no wheezes, crackles, rhonchi. No retractions. No resp. distress. No accessory muscle use. ABD: S, NT, ND EXTR: No c/c/e NEURO Normal gait.  PSYCH: Normally interactive. Conversant. Not depressed or anxious appearing.  Calm demeanor.    Assessment and Plan: Difficulty sleeping - Plan: traZODone (DESYREL) 50 MG tablet  Screening for thyroid disorder - Plan: TSH  Screening for diabetes mellitus - Plan: Hemoglobin A1c  Screening for hyperlipidemia - Plan: Lipid panel  Irritable bowel syndrome without diarrhea - Plan: Ambulatory referral to Gastroenterology  Non-seasonal allergic rhinitis, unspecified trigger - Plan: montelukast (SINGULAIR) 10 MG tablet  Here today to establish care and discuss a few concerns She has an OBG provider Former pt of T[facility], but has not been seen at [facility] in a while Will check labs for her today as above- Will plan further follow- up pending labs. Will try adding Singulair for her persistent sinus sx Will also try trazodone for her insomnia.    Signed Abbe Amsterdam, MD  Received her labs  Results for orders placed or performed in visit on 03/01/17  TSH  Result Value Ref Range   TSH 1.65 0.35 - 4.50 uIU/mL  Hemoglobin A1c  Result Value Ref Range   Hgb A1c MFr Bld 5.9 4.6 -  6.5 %  Lipid panel  Result Value Ref Range   Cholesterol 168 0 - 200 mg/dL   Triglycerides 16.1 0.0 - 149.0 mg/dL   HDL 09.60 >45.40 mg/dL   VLDL 98.1 0.0 - 19.1 mg/dL   LDL Cholesterol 478 (H) 0 - 99 mg/dL   Total CHOL/HDL Ratio 3    NonHDL 118.37

## 2017-03-15 ENCOUNTER — Telehealth: Payer: Self-pay | Admitting: Family Medicine

## 2017-03-15 NOTE — Telephone Encounter (Signed)
Caller name: Michele Roberts Relationship to patient: self Can be reached: [phone]  Reason for call: pt calling stating never recd lab results from 03/01/17. Requesting call back today.

## 2017-03-15 NOTE — Telephone Encounter (Signed)
Called pt. Discussed lab results and informed that lab results were mailed out and she should receive them soon. Pt verbalized understanding.

## 2017-04-05 ENCOUNTER — Ambulatory Visit: Payer: Commercial Managed Care - PPO | Admitting: Gastroenterology

## 2017-04-10 ENCOUNTER — Encounter: Payer: Self-pay | Admitting: Family Medicine

## 2017-04-26 ENCOUNTER — Telehealth: Payer: Self-pay | Admitting: Family Medicine

## 2017-04-26 ENCOUNTER — Encounter: Payer: Self-pay | Admitting: Medical

## 2017-04-26 ENCOUNTER — Ambulatory Visit (INDEPENDENT_AMBULATORY_CARE_PROVIDER_SITE_OTHER): Payer: Commercial Managed Care - PPO | Admitting: Medical

## 2017-04-26 VITALS — BP 106/65 | HR 88 | Temp 98.7°F | Resp 16 | Ht <= 58 in | Wt 164.2 lb

## 2017-04-26 DIAGNOSIS — M940 Chondrocostal junction syndrome [Tietze]: Secondary | ICD-10-CM

## 2017-04-26 DIAGNOSIS — R51 Headache: Secondary | ICD-10-CM

## 2017-04-26 DIAGNOSIS — Z8709 Personal history of other diseases of the respiratory system: Secondary | ICD-10-CM

## 2017-04-26 DIAGNOSIS — M79605 Pain in left leg: Secondary | ICD-10-CM

## 2017-04-26 DIAGNOSIS — R519 Headache, unspecified: Secondary | ICD-10-CM

## 2017-04-26 DIAGNOSIS — Z87898 Personal history of other specified conditions: Secondary | ICD-10-CM

## 2017-04-26 DIAGNOSIS — R0789 Other chest pain: Secondary | ICD-10-CM

## 2017-04-26 DIAGNOSIS — J3489 Other specified disorders of nose and nasal sinuses: Secondary | ICD-10-CM | POA: Diagnosis not present

## 2017-04-26 DIAGNOSIS — M79604 Pain in right leg: Secondary | ICD-10-CM

## 2017-04-26 LAB — TROPONIN I: TNIDX: 0 ug/L (ref 0.00–0.06)

## 2017-04-26 LAB — D-DIMER, QUANTITATIVE: D-Dimer, Quant: 0.43 mcg/mL FEU (ref <0.50)

## 2017-04-26 MED ORDER — RIZATRIPTAN BENZOATE 5 MG PO TABS: 5.0000 mg | ORAL_TABLET | ORAL | 0 refills | Status: DC | PRN

## 2017-04-26 MED ORDER — AZELASTINE HCL 0.1 % NA SOLN: 2.0000 | Freq: Two times a day (BID) | NASAL | 2 refills | Status: DC

## 2017-04-26 MED ORDER — CYCLOBENZAPRINE HCL 10 MG PO TABS: 10.0000 mg | ORAL_TABLET | Freq: Every day | ORAL | 0 refills | Status: DC

## 2017-04-26 MED ORDER — KETOROLAC TROMETHAMINE 60 MG/2ML IM SOLN
60.0000 mg | Freq: Once | INTRAMUSCULAR | Status: AC
  Administered 2017-04-26: 60 mg via INTRAMUSCULAR

## 2017-04-26 NOTE — Telephone Encounter (Signed)
PCP notified. Rx sent to pharmacy.

## 2017-04-26 NOTE — Patient Instructions (Addendum)
Your chest pain is likely costochondritis. However do follow up and go through with stress test. You got toradol today but if pain features changes or become severe/constant then ED evaluation again. Ekg today appeared normal. Troponin being done to make sure none of recent symptoms actually effected heart.  For ha I think you may have had tension type features recently and neg ct of head reassuring. Will see if toradol stops current ha. And see if flexeril use at night helps. Update us tomorrow if ha persists. If ha change severe with worrisome features as discussed then ED evaluation.  For faint sinus pressure frontal area and hx of allergies continue flonase and adding astelin. If frontal pressure persists despite adding astelin by Friday may rx antibiotic,  For calf pain/tight sensation in context of sob 2 days ago decided to get d-dimer. If test postiive then need to decide if need US of both lower ext and if would also get ct of chest.  Follow up in 5-7 days or as needed  Can start naprosyn tomorrow afternoon. Please let me know if you told get stress test appointment call by Friday. In that event I would go ahead and arrange make a referral through our office

## 2017-04-26 NOTE — Telephone Encounter (Signed)
Caller name: Pryor MontesMarilin Cristina Relationship to patient: self Can be reached: [phone] Pharmacy: [id number] - [place], [place] - [address] AT [address]OSS [address]  Reason for call: Pt went to pick up RXs from pharmacy but provider forgot to send Maxalt for migraines. Please send in for her.

## 2017-04-26 NOTE — Addendum Note (Signed)
Addended by: Orlene OchRENCE, Amelia Macken N on: 04/26/2017 01:32 PM   Modules accepted: Orders

## 2017-04-26 NOTE — Progress Notes (Signed)
Subjective:    Patient ID: Michele Roberts, female    DOB: 26-Dec-1975, 41 y.o.   MRN: [id number]  HPI  Pt in for follow up from ED. Pt was see in ED on April 24, 2017. Pt states her symptoms started on Sunday. She states pain started at work. Initial onset of dizziness, chest pressure and some mid chest dicsomfort. Pt had symptoms 2 consecutive days of dizziness and mild chest discomfort.    Pt had ekg and cardiac studies/troponin. ekg nsr and troponin was negative.  Pt was told she will be set up for stress test. Has not been called yet. Also given naprosyn for possible costochondritis.  Pt also had HA day she went to ED. HA improved compared to on Sunday but some pain still present. Some pain back of her head and some trapezius pain on rt side. In ED her CT scan was negative. Pt also states hx of migraine ha in past. But this most this ha was not light or sound sensitive. No nausea or vomiting.  Pt states she has been stressed recently. 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 Pt works pt Comptroller.  Dad family history negative for stroke or heart disease. Mother passed away from homicide. She was healthy. Pt ldl mild elevated in the past.  Pt does not smoke.  LMP- mirena.   Pt has not been taking naprosyn. She has not filled this yet.  Then later noted some tightness in back of her legs.    Review of Systems  Constitutional: Negative for chills, fatigue and fever.  HENT: Positive for congestion and sinus pressure. Negative for facial swelling and postnasal drip.        Chronic sinus pressure and allergies. No acute obvious flare recently.  Respiratory: Negative for cough, chest tightness and wheezing.        Some sob 2 days ago when had chest pain. None current  Cardiovascular: Negative for chest pain and palpitations.  Gastrointestinal: Negative for abdominal pain.  Genitourinary: Negative for dysuria and flank pain.  Musculoskeletal: Negative for back pain and  neck pain.       Back of calfs feel mild tight  Skin: Negative for rash.  Neurological: Negative for dizziness and headaches.       Dizziness on day went to ED but not today.  Hematological: Negative for adenopathy. Does not bruise/bleed easily.  Psychiatric/Behavioral: Negative for behavioral problems, self-injury and suicidal ideas.   Past Medical History:  Diagnosis Date  . Asthma   . GERD (gastroesophageal reflux disease)   . Migraines   . Migraines      Social History   Social History  . Marital status: Divorced    Spouse name: N/A  . Number of children: N/A  . Years of education: N/A   Occupational History  . Not on file.   Social History Main Topics  . Smoking status: Never Smoker  . Smokeless tobacco: Never Used  . Alcohol use No  . Drug use: No  . Sexual activity: Yes    Birth control/ protection: IUD   Other Topics Concern  . Not on file   Social History Narrative  . No narrative on file    Past Surgical History:  Procedure Laterality Date  . APPENDECTOMY    . CESAREAN SECTION      Family History  Problem Relation Age of Onset  . Stroke Maternal Grandmother   . Hypertension Neg Hx   . Diabetes Neg Hx   .  Cancer Neg Hx     Allergies  Allergen Reactions  . Amoxicillin     Burns skin  . Banana     Throat itches  . Latex     Burns skin  . Other     Dust Mites; Dogs, Cats  . Pineapple     Throat itches  . Shrimp [Shellfish Allergy]     Current Outpatient Prescriptions on File Prior to Visit  Medication Sig Dispense Refill  . aspirin-acetaminophen-caffeine (EXCEDRIN MIGRAINE) 250-250-65 MG tablet Take 1 tablet by mouth as needed for headache.    . cetirizine (ZYRTEC) 10 MG tablet Take 10 mg by mouth daily.    . fluticasone (FLONASE) 50 MCG/ACT nasal spray Place 1 spray into both nostrils daily.    Marland Kitchen levonorgestrel (MIRENA) 20 MCG/24HR IUD by Intrauterine route.    . montelukast (SINGULAIR) 10 MG tablet Take 1 tablet (10 mg total) by  mouth at bedtime. 30 tablet 6  . rizatriptan (MAXALT) 5 MG tablet Take 1 tablet by mouth as needed.    . traZODone (DESYREL) 50 MG tablet Take 0.5-1 tablets (25-50 mg total) by mouth at bedtime as needed for sleep. 30 tablet 6   No current facility-administered medications on file prior to visit.     BP 106/65   Pulse 88   Temp 98.7 F (37.1 C) (Oral)   Resp 16   Ht 4\' 10"  (1.473 m)   Wt 164 lb 3.2 oz (74.5 kg)   SpO2 100%   BMI 34.32 kg/m       Objective:   Physical Exam   General Mental Status- Alert. General Appearance- Not in acute distress.   Skin General: Color- Normal Color. Moisture- Normal Moisture.  Neck Carotid Arteries- Normal color. Moisture- Normal Moisture. No carotid bruits. No JVD.  Chest and Lung Exam Auscultation: Breath Sounds:-Normal.  Cardiovascular Auscultation:Rythm- Regular. Murmurs & Other Heart Sounds:Auscultation of the heart reveals- No Murmurs.  Abdomen Inspection:-Inspeection Normal. Palpation/Percussion:Note:No mass. Palpation and Percussion of the abdomen reveal- Non Tender, Non Distended + BS, no rebound or guarding.  Anterior chest- no pain on palpation. Faint pain on deep inspiration costochondral junciton.  Legs- no pedal edema. Negative homans signs. Symmetric calfs.    Neurologic Cranial Nerve exam:- CN III-XII intact(No nystagmus), symmetric smile. Drift Test:- No drift. Finger to Nose:- Normal/Intact Strength:- 5/5 equal and symmetric strength both upper and lower extremities.   HEENT Head- Normal. Ear Auditory Canal - Left- Normal. Right - Normal.Tympanic Membrane- Left- Normal. Right- Normal. Eye Sclera/Conjunctiva- Left- Normal. Right- Normal. Nose & Sinuses Nasal Mucosa- Left-  Boggy and Congested. Right-  Boggy and  Congested.Bilateral no  maxillary but   frontal sinus pressure. Mouth & Throat Lips: Upper Lip- Normal: no dryness, cracking, pallor, cyanosis, or vesicular eruption. Lower Lip-Normal: no  dryness, cracking, pallor, cyanosis or vesicular eruption. Buccal Mucosa- Bilateral- No Aphthous ulcers. Oropharynx- No Discharge or Erythema. Tonsils: Characteristics- Bilateral- No Erythema or Congestion. Size/Enlargement- Bilateral- No enlargement. Discharge- bilateral-None.       Assessment & Plan:  Your chest pain is likely costochondritis. However do follow up and go through with stress test. You got toradol today but if pain features changes or become severe/constant then ED evaluation again. Ekg today appeared normal sinus rhythm with some artifact. Troponin being done to make sure none of recent symptoms actually effected heart.  For ha I think you may have had tension type features recently and neg ct of head reassuring. Will see if toradol stops current  ha. And see if flexeril use at night helps. Update us tomorrow if ha persists. If ha change severe with worrisome features as discussed then ED evaluation.  For faint sinus pressure frontal area and hx of allergies continue flonase and adding astelin. If frontal pressure persists despite adding astelin by Friday may rx antibiotic,  For calf pain/tight sensation in context of sob 2 days ago decided to get d-dimer. If test postiive then need to decide if need US of both lower ext and if would also get ct of chest.  Follow up in 5-7 days or as needed  Can start naprosyn tomorrow afternoon. Please let me know if you told get stress test appointment call by Friday. In that event I would go ahead and arrange make a referral through our office  Coty Larsh, Ramon Dredgedward, [place]-C

## 2017-04-26 NOTE — Telephone Encounter (Signed)
[facility] reporting critical D-Dimer @ 0.43

## 2017-08-23 ENCOUNTER — Encounter: Payer: Self-pay | Admitting: Gastroenterology

## 2017-10-20 ENCOUNTER — Ambulatory Visit: Payer: Commercial Managed Care - PPO | Admitting: Gastroenterology

## 2017-11-28 ENCOUNTER — Telehealth: Payer: Self-pay | Admitting: Family

## 2017-11-28 ENCOUNTER — Ambulatory Visit (INDEPENDENT_AMBULATORY_CARE_PROVIDER_SITE_OTHER): Payer: PRIVATE HEALTH INSURANCE | Admitting: Family

## 2017-11-28 ENCOUNTER — Emergency Department (HOSPITAL_BASED_OUTPATIENT_CLINIC_OR_DEPARTMENT_OTHER): Payer: PRIVATE HEALTH INSURANCE

## 2017-11-28 ENCOUNTER — Emergency Department (HOSPITAL_BASED_OUTPATIENT_CLINIC_OR_DEPARTMENT_OTHER)
Admission: EM | Admit: 2017-11-28 | Discharge: 2017-11-28 | Disposition: A | Discharge status: Home / Self Care | Payer: PRIVATE HEALTH INSURANCE | Attending: Emergency Medicine | Admitting: Emergency Medicine

## 2017-11-28 ENCOUNTER — Other Ambulatory Visit: Payer: Self-pay

## 2017-11-28 ENCOUNTER — Encounter: Payer: Self-pay | Admitting: Family

## 2017-11-28 ENCOUNTER — Encounter (HOSPITAL_BASED_OUTPATIENT_CLINIC_OR_DEPARTMENT_OTHER): Payer: Self-pay | Admitting: *Deleted

## 2017-11-28 VITALS — BP 111/60 | HR 82 | Temp 98.8°F | Resp 16 | Ht <= 58 in | Wt 166.0 lb

## 2017-11-28 DIAGNOSIS — J209 Acute bronchitis, unspecified: Secondary | ICD-10-CM | POA: Diagnosis not present

## 2017-11-28 DIAGNOSIS — R059 Cough, unspecified: Secondary | ICD-10-CM

## 2017-11-28 DIAGNOSIS — R6889 Other general symptoms and signs: Secondary | ICD-10-CM | POA: Diagnosis not present

## 2017-11-28 DIAGNOSIS — R0602 Shortness of breath: Secondary | ICD-10-CM | POA: Diagnosis present

## 2017-11-28 DIAGNOSIS — R062 Wheezing: Secondary | ICD-10-CM | POA: Diagnosis not present

## 2017-11-28 DIAGNOSIS — R05 Cough: Secondary | ICD-10-CM | POA: Diagnosis not present

## 2017-11-28 DIAGNOSIS — Z79899 Other long term (current) drug therapy: Secondary | ICD-10-CM | POA: Diagnosis not present

## 2017-11-28 DIAGNOSIS — Z91040 Latex allergy status: Secondary | ICD-10-CM | POA: Diagnosis not present

## 2017-11-28 DIAGNOSIS — R0981 Nasal congestion: Secondary | ICD-10-CM | POA: Diagnosis not present

## 2017-11-28 DIAGNOSIS — J4551 Severe persistent asthma with (acute) exacerbation: Secondary | ICD-10-CM | POA: Diagnosis not present

## 2017-11-28 DIAGNOSIS — J45909 Unspecified asthma, uncomplicated: Secondary | ICD-10-CM | POA: Insufficient documentation

## 2017-11-28 LAB — POCT INFLUENZA A/B
Influenza A, POC: NEGATIVE
Influenza B, POC: NEGATIVE

## 2017-11-28 MED ORDER — PREDNISONE 10 MG PO TABS: ORAL_TABLET | ORAL | 0 refills | Status: DC

## 2017-11-28 MED ORDER — IPRATROPIUM BROMIDE 0.02 % IN SOLN
0.5000 mg | Freq: Once | RESPIRATORY_TRACT | Status: AC
  Administered 2017-11-28: 0.5 mg via RESPIRATORY_TRACT
  Filled 2017-11-28: qty 2.5

## 2017-11-28 MED ORDER — ALBUTEROL SULFATE (2.5 MG/3ML) 0.083% IN NEBU
5.0000 mg | INHALATION_SOLUTION | Freq: Once | RESPIRATORY_TRACT | Status: AC
  Administered 2017-11-28: 5 mg via RESPIRATORY_TRACT
  Filled 2017-11-28: qty 6

## 2017-11-28 MED ORDER — PANTOPRAZOLE SODIUM 40 MG PO TBEC: 40.0000 mg | DELAYED_RELEASE_TABLET | Freq: Every day | ORAL | 3 refills | Status: DC

## 2017-11-28 MED ORDER — ALBUTEROL SULFATE (2.5 MG/3ML) 0.083% IN NEBU
2.5000 mg | INHALATION_SOLUTION | Freq: Once | RESPIRATORY_TRACT | Status: AC
Start: 2017-11-28 — End: 2017-11-28
  Administered 2017-11-28: 2.5 mg via RESPIRATORY_TRACT

## 2017-11-28 MED ORDER — AZITHROMYCIN 250 MG PO TABS
500.0000 mg | ORAL_TABLET | Freq: Once | ORAL | Status: AC
  Administered 2017-11-28: 500 mg via ORAL
  Filled 2017-11-28: qty 2

## 2017-11-28 MED ORDER — PREDNISONE 20 MG PO TABS: 60.0000 mg | ORAL_TABLET | Freq: Every day | ORAL | 0 refills | Status: DC

## 2017-11-28 MED ORDER — ALBUTEROL SULFATE (2.5 MG/3ML) 0.083% IN NEBU
5.0000 mg | INHALATION_SOLUTION | Freq: Once | RESPIRATORY_TRACT | Status: AC
  Administered 2017-11-28: 5 mg via RESPIRATORY_TRACT
  Filled 2017-11-28 (×2): qty 6

## 2017-11-28 MED ORDER — PREDNISONE 50 MG PO TABS
60.0000 mg | ORAL_TABLET | Freq: Once | ORAL | Status: AC
Start: 2017-11-28 — End: 2017-11-28
  Administered 2017-11-28: 09:00:00 60 mg via ORAL
  Filled 2017-11-28: qty 1

## 2017-11-28 MED ORDER — AZITHROMYCIN 250 MG PO TABS: 250.0000 mg | ORAL_TABLET | Freq: Every day | ORAL | 0 refills | Status: DC

## 2017-11-28 MED ORDER — GUAIFENESIN 100 MG/5ML PO SOLN
15.0000 mL | Freq: Once | ORAL | Status: AC
  Administered 2017-11-28: 300 mg via ORAL
  Filled 2017-11-28: qty 10

## 2017-11-28 MED ORDER — ALBUTEROL SULFATE HFA 108 (90 BASE) MCG/ACT IN AERS: 2.0000 | INHALATION_SPRAY | Freq: Four times a day (QID) | RESPIRATORY_TRACT | 0 refills | Status: AC | PRN

## 2017-11-28 MED ORDER — ACETAMINOPHEN 500 MG PO TABS
1000.0000 mg | ORAL_TABLET | Freq: Once | ORAL | Status: AC
  Administered 2017-11-28: 1000 mg via ORAL
  Filled 2017-11-28: qty 2

## 2017-11-28 MED FILL — AZITHROMYCIN 250 MG TABLET: 250 | 4 days supply | Qty: 4 | Fill #0

## 2017-11-28 MED FILL — predniSONE 20 MG TABS: 20 | 4 days supply | Qty: 12 | Fill #0

## 2017-11-28 NOTE — Progress Notes (Signed)
Subjective:    Patient ID: Michele Roberts, female    DOB: Nov 25, 1975, 42 y.o.   MRN: [id number]  HPI  Patient is a 42 yr old female who presents today with chief complaint of cough/sob. She was seen in the ED on 2/17 for same.  ER record is reviewed in care everywhere.  She was seen at [facility] on 11/26/2017.  She underwent a chest x-ray which showed no active cardiopulmonary disease and no evidence of pneumonia or pulmonary edema. During her visit she had a negative troponin.  D-dimer was within normal limits.  Her strep screen was negative.  White blood cell count was mildly elevated at 12.7.  She was treated with IV Decadron.  Reports some gerd symptoms.  Reports that she has been using otc antacids. Reports that she has been having chronic cough even before this recent worsening of her symptoms. Working with an ENT due to recurrent sinusitis and reports that he plans to allergy test her.   Reports that she has been up all night coughing despite use of tussionex.  Took 4 mg of prednisone yesterday. She reports that she had tmax of 101 in the ED. Has not checked since yesterday. Was in bed all day yesterday "I can barely move."  She reports some wheezing last night. Was not given    Review of Systems See HPI  Past Medical History:  Diagnosis Date  . Asthma   . GERD (gastroesophageal reflux disease)   . Migraines   . Migraines      Social History   Socioeconomic History  . Marital status: Divorced    Spouse name: Not on file  . Number of children: Not on file  . Years of education: Not on file  . Highest education level: Not on file  Social Needs  . Financial resource strain: Not on file  . Food insecurity - worry: Not on file  . Food insecurity - inability: Not on file  . Transportation needs - medical: Not on file  . Transportation needs - non-medical: Not on file  Occupational History  . Not on file  Tobacco Use  . Smoking status: Never  Smoker  . Smokeless tobacco: Never Used  Substance and Sexual Activity  . Alcohol use: No  . Drug use: No  . Sexual activity: Yes    Birth control/protection: IUD  Other Topics Concern  . Not on file  Social History Narrative  . Not on file    Past Surgical History:  Procedure Laterality Date  . APPENDECTOMY    . CESAREAN SECTION      Family History  Problem Relation Age of Onset  . Stroke Maternal Grandmother   . Hypertension Neg Hx   . Diabetes Neg Hx   . Cancer Neg Hx     Allergies  Allergen Reactions  . Amoxicillin     Burns skin  . Banana     Throat itches  . Latex     Burns skin  . Other     Dust Mites; Dogs, Cats  . Pineapple     Throat itches  . Shrimp [Shellfish Allergy]     Current Outpatient Medications on File Prior to Visit  Medication Sig Dispense Refill  . aspirin-acetaminophen-caffeine (EXCEDRIN MIGRAINE) 250-250-65 MG tablet Take 1 tablet by mouth as needed for headache.    Marland Kitchen azelastine (ASTELIN) 0.1 % nasal spray Place 2 sprays into both nostrils 2 (two) times daily. Use in each  nostril as directed 30 mL 2  . cetirizine (ZYRTEC) 10 MG tablet Take 10 mg by mouth daily.    . cyclobenzaprine (FLEXERIL) 10 MG tablet Take 1 tablet (10 mg total) by mouth at bedtime. 7 tablet 0  . fluticasone (FLONASE) 50 MCG/ACT nasal spray Place 1 spray into both nostrils daily.    Marland Kitchen. levonorgestrel (MIRENA) 20 MCG/24HR IUD by Intrauterine route.    . montelukast (SINGULAIR) 10 MG tablet Take 1 tablet (10 mg total) by mouth at bedtime. 30 tablet 6  . rizatriptan (MAXALT) 5 MG tablet Take 1 tablet (5 mg total) by mouth as needed. 10 tablet 0  . traZODone (DESYREL) 50 MG tablet Take 0.5-1 tablets (25-50 mg total) by mouth at bedtime as needed for sleep. 30 tablet 6   No current facility-administered medications on file prior to visit.     BP 111/60 (BP Location: Right Arm, Patient Position: Sitting, Cuff Size: Large)   Pulse 82   Temp 98.8 F (37.1 C) (Oral)    Resp 16   Ht 4\' 10"  (1.473 m)   Wt 166 lb (75.3 kg)   SpO2 100%   BMI 34.69 kg/m       Objective:   Physical Exam  Constitutional: She is oriented to person, place, and time. She appears well-developed and well-nourished.  HENT:  Head: Normocephalic and atraumatic.  Cardiovascular: Normal rate, regular rhythm and normal heart sounds.  No murmur heard. Pulmonary/Chest: Effort normal.  Soft right sided expiratory wheeze.   Increased work of breathing is noted- appears sob  Musculoskeletal: She exhibits no edema.  Neurological: She is alert and oriented to person, place, and time.  Psychiatric: She has a normal mood and affect. Her behavior is normal. Judgment and thought content normal.          Assessment & Plan:  Acute asthma exacerbation-  Albuterol nebulizer given today in the office. This will be followed by prn albuterol at home (MDI rx given). She did not improve much in terms of her SOB following albuterol MDI.  I have advised her that I think it would be best for her to be evaluated in the ED today.  I Suspect that gerd symptoms are contributing to her asthma symptoms. Will rx with protonix.  Rapid flu today is negative. Report given to Dr. Denton LankSteinl- ER physician on Duty in the ED at [facility].

## 2017-11-28 NOTE — ED Triage Notes (Signed)
Pt reports sob x 2/11, seen at [facility] er, dx with anxiety and chest congestion. Pt is hyperventilating at registration and initially during triage, after a few minutes during RT assessment pt is able to slow her resp and answer questions.

## 2017-11-28 NOTE — Telephone Encounter (Signed)
Opened in error

## 2017-11-28 NOTE — ED Notes (Signed)
Patient transported to X-ray 

## 2017-11-28 NOTE — Discharge Instructions (Signed)
It was our pleasure to provide your ER care today - we hope that you feel better.  Take prednisone and zithromax as prescribed.   Take protonix (acid blocker medication) as prescribed by your doctor.   Use inhaler 2 puffs every 3-4 hours as need.   Take acetaminophen as need for fever/aches.   Follow up with primary care doctor in the next few days for recheck.  Return to ER if worse, new symptoms, increased trouble breathing, other concern.

## 2017-11-28 NOTE — Patient Instructions (Signed)
Please go directly to the ED on the first floor for further evaluation.  

## 2017-11-28 NOTE — ED Provider Notes (Signed)
[facility] [place] EMERGENCY DEPARTMENT Provider Note   CSN: [id number] Arrival date & time: 11/28/17  9562     History   Chief Complaint Chief Complaint  Patient presents with  . Shortness of Breath    HPI Michele Roberts is a 42 y.o. female.  Patient w hx 'seasonal asthma' c/o persistent cough, non productive, congestion, and persistent wheezing/sob for the past few days. Was recently seen in ED for same, took dose of prednisone yesterday but not today. Went to clinic today, was given alb neb, and had flu test (pt indicates was negative), and sent to ED for eval. No sore throat. No chest pain. No leg pain or swelling. No vomiting or diarrhea. w asthma, uses mdi prn, no hx prior admission.    The history is provided by the patient.  Shortness of Breath  Associated symptoms include cough and wheezing. Pertinent negatives include no fever, no headaches, no sore throat, no neck pain, no chest pain, no vomiting, no abdominal pain, no rash and no leg swelling.    Past Medical History:  Diagnosis Date  . Asthma   . GERD (gastroesophageal reflux disease)   . Migraines   . Migraines     Patient Active Problem List   Diagnosis Date Noted  . Acute maxillary sinusitis 05/05/2014  . Otitis media 05/05/2014  . Dysuria 09/26/2012  . Possible exposure to STD 09/26/2012  . Acute sinusitis 08/24/2012  . Cough 12/06/2011  . IBS (irritable bowel syndrome) 08/29/2011  . General medical examination 08/29/2011  . ADJUSTMENT DISORDER WITH DEPRESSED MOOD 08/26/2010  . ABNORMAL BREATH SOUNDS 08/26/2010  . HELICOBACTER PYLORI INFECTION 07/21/2010  . MIGRAINE, MENSTRUAL 07/21/2010  . GERD 07/21/2010  . THYROID FUNCTION TEST, ABNORMAL 07/21/2010  . ALLERGIC REACTION 07/21/2010    Past Surgical History:  Procedure Laterality Date  . APPENDECTOMY    . CESAREAN SECTION      OB History    No data available       Home Medications    Prior to Admission medications     Medication Sig Start Date End Date Taking? Authorizing Provider  albuterol (PROVENTIL HFA;VENTOLIN HFA) 108 (90 Base) MCG/ACT inhaler Inhale 2 puffs into the lungs every 6 (six) hours as needed for wheezing or shortness of breath. 11/28/17   Sandford Craze, NP  aspirin-acetaminophen-caffeine (EXCEDRIN MIGRAINE) [phone] MG tablet Take 1 tablet by mouth as needed for headache.    [provider]  azelastine (ASTELIN) 0.1 % nasal spray Place 2 sprays into both nostrils 2 (two) times daily. Use in each nostril as directed 04/26/17   Saguier, Ramon Dredge, PA-C  cetirizine (ZYRTEC) 10 MG tablet Take 10 mg by mouth daily.    [provider]  cyclobenzaprine (FLEXERIL) 10 MG tablet Take 1 tablet (10 mg total) by mouth at bedtime. 04/26/17   Saguier, Ramon Dredge, PA-C  fluticasone (FLONASE) 50 MCG/ACT nasal spray Place 1 spray into both nostrils daily.    [provider]  levonorgestrel (MIRENA) 20 MCG/24HR IUD by Intrauterine route.    [provider]  montelukast (SINGULAIR) 10 MG tablet Take 1 tablet (10 mg total) by mouth at bedtime. 03/01/17   Copland, Gwenlyn Found, MD  pantoprazole (PROTONIX) 40 MG tablet Take 1 tablet (40 mg total) by mouth daily. 11/28/17   Sandford Craze, NP  predniSONE (DELTASONE) 10 MG tablet 4 tabs by mouth once daily for 2 days, then 3 tabs daily x 2 days, then 2 tabs daily x 2 days, then 1 tab  daily x 2 days 11/28/17   Sandford Craze'Sullivan, Melissa, NP  rizatriptan (MAXALT) 5 MG tablet Take 1 tablet (5 mg total) by mouth as needed. 04/26/17   Saguier, Ramon DredgeEdward, PA-C  traZODone (DESYREL) 50 MG tablet Take 0.5-1 tablets (25-50 mg total) by mouth at bedtime as needed for sleep. 03/01/17   Copland, Gwenlyn FoundJessica C, MD    Family History Family History  Problem Relation Age of Onset  . Stroke Maternal Grandmother   . Hypertension Neg Hx   . Diabetes Neg Hx   . Cancer Neg Hx     Social History Social History   Tobacco Use  . Smoking status: Never Smoker  .  Smokeless tobacco: Never Used  Substance Use Topics  . Alcohol use: No  . Drug use: No     Allergies   Amoxicillin; Banana; Latex; Other; Pineapple; and Shrimp [shellfish allergy]   Review of Systems Review of Systems  Constitutional: Negative for fever.  HENT: Positive for congestion. Negative for sore throat.   Eyes: Negative for redness.  Respiratory: Positive for cough, shortness of breath and wheezing.   Cardiovascular: Negative for chest pain and leg swelling.  Gastrointestinal: Negative for abdominal pain and vomiting.  Genitourinary: Negative for flank pain.  Musculoskeletal: Negative for back pain and neck pain.  Skin: Negative for rash.  Neurological: Negative for headaches.  Hematological: Does not bruise/bleed easily.  Psychiatric/Behavioral: Negative for confusion.     Physical Exam Updated Vital Signs BP 128/72 (BP Location: Right Arm)   Pulse 80   Resp 16   SpO2 100%   Physical Exam  Constitutional: She appears well-developed and well-nourished. No distress.  HENT:  Mouth/Throat: Oropharynx is clear and moist.  Eyes: Conjunctivae are normal. No scleral icterus.  Neck: Neck supple. No tracheal deviation present.  No stiffness or rigidity  Cardiovascular: Normal rate, regular rhythm, normal heart sounds and intact distal pulses. Exam reveals no gallop and no friction rub.  No murmur heard. Pulmonary/Chest: Effort normal. No respiratory distress. She has wheezes.  Abdominal: Soft. Normal appearance and bowel sounds are normal. She exhibits no distension. There is no tenderness.  Musculoskeletal: She exhibits no edema or tenderness.  Neurological: She is alert.  Skin: Skin is warm and dry. No rash noted. She is not diaphoretic.  Psychiatric: She has a normal mood and affect.  Nursing note and vitals reviewed.    ED Treatments / Results  Labs (all labs ordered are listed, but only abnormal results are displayed) Labs Reviewed - No data to  display  EKG  EKG Interpretation None       Radiology Dg Chest 2 View  Result Date: 11/28/2017 CLINICAL DATA:  Cough for 2 days. EXAM: CHEST  2 VIEW COMPARISON:  11/26/2017. FINDINGS: Mediastinum and hilar structures normal. Heart size normal. No focal infiltrate. No pleural effusion or pneumothorax. IMPRESSION: No acute cardiopulmonary disease.  Chest is stable from 11/26/2017. Electronically Signed   By: Maisie Fushomas  Register   On: 11/28/2017 09:23    Procedures Procedures (including critical care time)  Medications Ordered in ED Medications  predniSONE (DELTASONE) tablet 60 mg (not administered)  albuterol (PROVENTIL) (2.5 MG/3ML) 0.083% nebulizer solution 5 mg (not administered)  ipratropium (ATROVENT) nebulizer solution 0.5 mg (not administered)     Initial Impression / Assessment and Plan / ED Course  I have reviewed the triage vital signs and the nursing notes.  Pertinent labs & imaging results that were available during my care of the patient were reviewed by me and  considered in my medical decision making (see chart for details).  Albuterol and atrovent neb. Prednisone po.    Cxr.  Reviewed nursing notes and prior charts for additional history.  In past couple days, pt reports neg flu test. Also has labs in epic including trop neg, d dimer normal, and chem/cbc from outside facility.   Pt with cough, congestion, ?resp infxn contributing to asthma exacerbation.  Flu test neg.  Will rx abx. Pts pcp also gave rx protonix as gerd may be contributing to asthma.   Patient improved post neb x 2 in ED.    Breathing comfortably.  cxr reviewed - no pna on cxr.   Discussed results w pt.   Pt feels improved, and currently appears stable for d/c.   Return precautions provided.   Final Clinical Impressions(s) / ED Diagnoses   Final diagnoses:  None    ED Discharge Orders    None       Cathren Laine, MD 11/28/17 1052

## 2017-11-28 NOTE — ED Triage Notes (Signed)
Pt brought to ed by [facility] in w/c, was given albuterol aerosol neb there per staff report.

## 2017-11-29 ENCOUNTER — Ambulatory Visit: Payer: Self-pay | Admitting: Gastroenterology

## 2017-11-29 ENCOUNTER — Telehealth: Payer: Self-pay | Admitting: *Deleted

## 2017-11-29 MED ORDER — OMEPRAZOLE 40 MG PO CPDR: 40.0000 mg | DELAYED_RELEASE_CAPSULE | Freq: Every day | ORAL | 3 refills | Status: DC

## 2017-11-29 MED ORDER — EPINEPHRINE 0.3 MG/0.3ML IJ SOAJ: 0.3000 mg | Freq: Once | INTRAMUSCULAR | 0 refills | Status: AC

## 2017-11-29 NOTE — Telephone Encounter (Signed)
Received fax form [facility] that Pantoprazole is not covered by pt's insurance and they request an alternative.  Please advise?

## 2017-11-29 NOTE — Telephone Encounter (Signed)
Notified pt and she voices understanding.  Pt inquiring about refill for Epi pen. States she has been out for a while but usually keeps one on hand due to her allergies.  Please advise?

## 2017-11-29 NOTE — Telephone Encounter (Signed)
rx sent for omeprazole instead.  

## 2017-11-29 NOTE — Telephone Encounter (Signed)
rx has been sent 

## 2018-01-14 NOTE — Progress Notes (Deleted)
[facility] [address] [place], [place] [address] [phone]  Date:  01/17/2018   Name:  Michele Roberts   DOB:  July 19, 1976   MRN:  [id number]  PCP:  Pearline Cables, MD    Chief Complaint: No chief complaint on file.   History of Present Illness:  Michele Roberts is a 42 y.o. very pleasant female patient who presents with the following:  Here today for a CPE Last seen by myself in May of 2018,as below  Pap: per GYN Mammo: per GYN Labs: Tetanus:  From our last visit here: Former pt of Dr. Beverely Low, although she has not been seen by her for a couple of years. Has been seen at [facility] more recently Here today seeking a CPE She does have a GYN doctor who does her pap screening.  She sees Dr. 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  Between work and school and her children (she is a single mom) things are quite busy for her, and can feel overwhelming Her daughters are 5, 62, and 1 yo She also has her nephew living with him right now- he will be graduating HS next year.   She does have IBS She had an abnl thyroid test a while ago No recent chl or A1c test- she did eat this am, she had coffee and [employer].   She notes a long history of sensitive stomach since her oldest was born.  She would like to have a GI evaluation She was negative for H pylori in 2011 She had a ?UGI at [place] about 2 years ago.   She notes that due to her very busy schedule she sometimes cannot sleep- she has tried melatonin which  helps some but still sometimes her mind is racing too much.   She does not feel that she is really depressed per se.  Certainly no SI.  She would be interested in trying something like trazodone.   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  She started to have an allergy evaluation a couple of years ago but did not finish with this  She is using zyrtec and flonase. She also tried some allegra D, she uses a nasal flush daily      Patient Active Problem List   Diagnosis Date Noted  . Acute maxillary sinusitis 05/05/2014  . Otitis media 05/05/2014  . Dysuria 09/26/2012  . Possible exposure to STD 09/26/2012  . Acute sinusitis 08/24/2012  . Cough 12/06/2011  . IBS (irritable bowel syndrome) 08/29/2011  . General medical examination 08/29/2011  . ADJUSTMENT DISORDER WITH DEPRESSED MOOD 08/26/2010  . ABNORMAL BREATH SOUNDS 08/26/2010  . HELICOBACTER PYLORI INFECTION 07/21/2010  . MIGRAINE, MENSTRUAL 07/21/2010  . GERD 07/21/2010  . THYROID FUNCTION TEST, ABNORMAL 07/21/2010  . ALLERGIC REACTION 07/21/2010    Past Medical History:  Diagnosis Date  . Asthma   . GERD (gastroesophageal reflux disease)   . Migraines   .  Migraines     Past Surgical History:  Procedure Laterality Date  . APPENDECTOMY    . CESAREAN SECTION      Social History   Tobacco Use  . Smoking status: Never Smoker  . Smokeless tobacco: Never Used  Substance Use Topics  . Alcohol use: No  . Drug use: No    Family History  Problem Relation Age of Onset  . Stroke Maternal Grandmother   . Hypertension Neg Hx   . Diabetes Neg Hx   . Cancer Neg Hx     Allergies  Allergen Reactions  . Amoxicillin     Burns skin  . Banana     Throat itches  . Latex     Burns skin  . Other     Dust Mites; Dogs, Cats  . Pineapple     Throat itches  . Shrimp [Shellfish Allergy]     Medication list has been reviewed and updated.  Current Outpatient Medications on File Prior to Visit  Medication Sig Dispense  Refill  . albuterol (PROVENTIL HFA;VENTOLIN HFA) 108 (90 Base) MCG/ACT inhaler Inhale 2 puffs into the lungs every 6 (six) hours as needed for wheezing or shortness of breath. 1 Inhaler 0  . aspirin-acetaminophen-caffeine (EXCEDRIN MIGRAINE) 250-250-65 MG tablet Take 1 tablet by mouth as needed for headache.    Marland Kitchen. azelastine (ASTELIN) 0.1 % nasal spray Place 2 sprays into both nostrils 2 (two) times daily. Use in each nostril as directed 30 mL 2  . azithromycin (ZITHROMAX Z-PAK) 250 MG tablet Take 1 tablet (250 mg total) by mouth daily. Take as directed 4 tablet 0  . cetirizine (ZYRTEC) 10 MG tablet Take 10 mg by mouth daily.    . cyclobenzaprine (FLEXERIL) 10 MG tablet Take 1 tablet (10 mg total) by mouth at bedtime. 7 tablet 0  . fluticasone (FLONASE) 50 MCG/ACT nasal spray Place 1 spray into both nostrils daily.    Marland Kitchen. levonorgestrel (MIRENA) 20 MCG/24HR IUD by Intrauterine route.    . montelukast (SINGULAIR) 10 MG tablet Take 1 tablet (10 mg total) by mouth at bedtime. 30 tablet 6  . omeprazole (PRILOSEC) 40 MG capsule Take 1 capsule (40 mg total) by mouth daily. 30 capsule 3  . predniSONE (DELTASONE) 10 MG tablet 4 tabs by mouth once daily for 2 days, then 3 tabs daily x 2 days, then 2 tabs daily x 2 days, then 1 tab daily x 2 days 20 tablet 0  . predniSONE (DELTASONE) 20 MG tablet Take 3 tablets (60 mg total) by mouth daily. 12 tablet 0  . rizatriptan (MAXALT) 5 MG tablet Take 1 tablet (5 mg total) by mouth as needed. 10 tablet 0  . traZODone (DESYREL) 50 MG tablet Take 0.5-1 tablets (25-50 mg total) by mouth at bedtime as needed for sleep. 30 tablet 6   No current facility-administered medications on file prior to visit.     Review of Systems:  As per HPI- otherwise negative.   Physical Examination: There were no vitals filed for this visit. There were no vitals filed for this visit. There is no height or weight on file to calculate BMI. Ideal Body Weight:    GEN: WDWN, NAD,  Non-toxic, A & O x 3 HEENT: Atraumatic, Normocephalic. Neck supple. No masses, No LAD. Ears and Nose: No external deformity. CV: RRR, No M/G/R. No JVD. No thrill. No extra heart sounds. PULM: CTA B, no wheezes, crackles, rhonchi. No retractions. No resp. distress. No accessory muscle use. ABD:  S, NT, ND, +BS. No rebound. No HSM. EXTR: No c/c/e NEURO Normal gait.  PSYCH: Normally interactive. Conversant. Not depressed or anxious appearing.  Calm demeanor.    Assessment and Plan: ***  Signed Abbe Amsterdam, MD

## 2018-01-17 ENCOUNTER — Encounter: Payer: Self-pay | Admitting: Family Medicine

## 2018-01-23 ENCOUNTER — Encounter: Payer: Self-pay | Admitting: Family Medicine

## 2018-03-10 ENCOUNTER — Emergency Department (HOSPITAL_BASED_OUTPATIENT_CLINIC_OR_DEPARTMENT_OTHER)
Admission: EM | Admit: 2018-03-10 | Discharge: 2018-03-10 | Disposition: A | Discharge status: Home / Self Care | Payer: PRIVATE HEALTH INSURANCE | Attending: Emergency Medicine | Admitting: Emergency Medicine

## 2018-03-10 ENCOUNTER — Emergency Department (HOSPITAL_BASED_OUTPATIENT_CLINIC_OR_DEPARTMENT_OTHER): Payer: PRIVATE HEALTH INSURANCE

## 2018-03-10 ENCOUNTER — Other Ambulatory Visit: Payer: Self-pay

## 2018-03-10 ENCOUNTER — Encounter (HOSPITAL_BASED_OUTPATIENT_CLINIC_OR_DEPARTMENT_OTHER): Payer: Self-pay | Admitting: Emergency Medicine

## 2018-03-10 DIAGNOSIS — R1011 Right upper quadrant pain: Secondary | ICD-10-CM | POA: Insufficient documentation

## 2018-03-10 DIAGNOSIS — Z91040 Latex allergy status: Secondary | ICD-10-CM | POA: Insufficient documentation

## 2018-03-10 DIAGNOSIS — R232 Flushing: Secondary | ICD-10-CM | POA: Insufficient documentation

## 2018-03-10 DIAGNOSIS — R1084 Generalized abdominal pain: Secondary | ICD-10-CM | POA: Diagnosis present

## 2018-03-10 DIAGNOSIS — Z79899 Other long term (current) drug therapy: Secondary | ICD-10-CM | POA: Insufficient documentation

## 2018-03-10 DIAGNOSIS — J45909 Unspecified asthma, uncomplicated: Secondary | ICD-10-CM | POA: Insufficient documentation

## 2018-03-10 DIAGNOSIS — K760 Fatty (change of) liver, not elsewhere classified: Secondary | ICD-10-CM

## 2018-03-10 LAB — CBC WITH DIFFERENTIAL/PLATELET
Basophils Absolute: 0 10*3/uL (ref 0.0–0.1)
Basophils Relative: 0 %
Eosinophils Absolute: 0.2 10*3/uL (ref 0.0–0.7)
Eosinophils Relative: 2 %
HEMATOCRIT: 38 % (ref 36.0–46.0)
HEMOGLOBIN: 12.6 g/dL (ref 12.0–15.0)
LYMPHS ABS: 2.7 10*3/uL (ref 0.7–4.0)
Lymphocytes Relative: 28 %
MCH: 27.9 pg (ref 26.0–34.0)
MCHC: 33.2 g/dL (ref 30.0–36.0)
MCV: 84.3 fL (ref 78.0–100.0)
MONOS PCT: 10 %
Monocytes Absolute: 1 10*3/uL (ref 0.1–1.0)
NEUTROS ABS: 5.9 10*3/uL (ref 1.7–7.7)
NEUTROS PCT: 60 %
Platelets: 300 10*3/uL (ref 150–400)
RBC: 4.51 MIL/uL (ref 3.87–5.11)
RDW: 12.8 % (ref 11.5–15.5)
WBC: 9.8 10*3/uL (ref 4.0–10.5)

## 2018-03-10 LAB — URINALYSIS, ROUTINE W REFLEX MICROSCOPIC
Bilirubin Urine: NEGATIVE
Glucose, UA: NEGATIVE mg/dL
Hgb urine dipstick: NEGATIVE
KETONES UR: NEGATIVE mg/dL
Leukocytes, UA: NEGATIVE
Nitrite: NEGATIVE
PH: 6.5 (ref 5.0–8.0)
Protein, ur: NEGATIVE mg/dL
Specific Gravity, Urine: <1.005 — ABNORMAL LOW (ref 1.005–1.030)

## 2018-03-10 LAB — COMPREHENSIVE METABOLIC PANEL
ALK PHOS: 65 U/L (ref 38–126)
ALT: 31 U/L (ref 14–54)
ANION GAP: 9 (ref 5–15)
AST: 26 U/L (ref 15–41)
Albumin: 4 g/dL (ref 3.5–5.0)
BUN: 12 mg/dL (ref 6–20)
CALCIUM: 8.7 mg/dL — AB (ref 8.9–10.3)
CO2: 23 mmol/L (ref 22–32)
Chloride: 105 mmol/L (ref 101–111)
Creatinine, Ser: 0.62 mg/dL (ref 0.44–1.00)
GFR calc Af Amer: >60 mL/min (ref >60)
GFR calc non Af Amer: >60 mL/min (ref >60)
GLUCOSE: 90 mg/dL (ref 65–99)
POTASSIUM: 3.6 mmol/L (ref 3.5–5.1)
Sodium: 137 mmol/L (ref 135–145)
Total Bilirubin: 0.6 mg/dL (ref 0.3–1.2)
Total Protein: 7.4 g/dL (ref 6.5–8.1)

## 2018-03-10 LAB — LIPASE, BLOOD: Lipase: 32 U/L (ref 11–51)

## 2018-03-10 LAB — PREGNANCY, URINE: Preg Test, Ur: NEGATIVE

## 2018-03-10 MED ORDER — ESOMEPRAZOLE MAGNESIUM 20 MG PO CPDR: 20.0000 mg | DELAYED_RELEASE_CAPSULE | Freq: Every day | ORAL | 0 refills | Status: DC

## 2018-03-10 MED ORDER — SUCRALFATE 1 G PO TABS: 1.0000 g | ORAL_TABLET | Freq: Three times a day (TID) | ORAL | 0 refills | Status: DC

## 2018-03-10 MED ORDER — DICYCLOMINE HCL 20 MG PO TABS: 20.0000 mg | ORAL_TABLET | Freq: Two times a day (BID) | ORAL | 0 refills | Status: DC

## 2018-03-10 NOTE — ED Notes (Signed)
Back from US, alert, NAD, calm.

## 2018-03-10 NOTE — ED Notes (Addendum)
Alert, NAD, calm, interactive, resps e/u, speaking in clear complete sentences, no dyspnea noted, skin W&D, reports continued pain and itching, also intermittent mild nausea, (denies: fever, bleeding, sob, VD, dizziness or visual changes). Diagnostics resulted. Pending re-eval. Trialing PO soda.

## 2018-03-10 NOTE — Discharge Instructions (Addendum)
Please see the information and instructions below regarding your visit.  Your diagnoses today include:  1. Generalized abdominal pain   2. RUQ pain   3. Flushing   4. Fatty liver    There are many causes of flushing.  Many of these can be hormonal.  Many of these require outpatient blood work that can take several days to come back, so I would like you to reestablish primary care as soon as possible so that this will can be initiated if you continue to have symptoms of flushing.  Your lab work is very reassuring today, and we do not see an emergent cause for the flushing.  Your exam and testing today is reassuring that there is not a condition causing your abdominal pain that we immediately need to intervene on at this time.   Abdominal (belly) pain can be caused by many things. Your caregiver performed an examination and possibly ordered blood/urine tests and imaging (CT scan, x-rays, ultrasound). Many cases can be observed and treated at home after initial evaluation in the emergency department. Even though you are being discharged home, abdominal pain can be unpredictable. Therefore, you need a repeated exam if your pain does not resolve, returns, or worsens. Most patients with abdominal pain don't have to be admitted to the hospital or have surgery, but serious problems like appendicitis and gallbladder attacks can start out as nonspecific pain. Many abdominal conditions cannot be diagnosed in one visit, so follow-up evaluations are very important.  Tests performed today include: Blood counts and electrolytes Blood tests to check liver and kidney function Blood tests to check pancreas function Urine test to look for infection and pregnancy (in women) Vital signs. See below for your results today.  Your ultrasound did show that you have a condition called fatty liver.  This is a very common condition.  When you reestablish care with your gastroenterologist, this can be followed by them.  Your  liver function testing was completely normal today.  See side panel of your discharge paperwork for testing performed today. Vital signs are listed at the bottom of these instructions.   Medications prescribed:    Take any prescribed medications only as prescribed, and any over the counter medications only as directed on the packaging.  You are prescribed Nexium.  Please take this once daily.  This will reduce acid in your stomach.  You are prescribed Carafate.  This is a medication to coat the lining of the stomach.  It can be taken up to 4 times a day.  You may take Bentyl.  This is a medication that is an antispasmodic.  Home care instructions:  Try eating, but start with foods that have a lot of fluid in them. Good examples are soup, Jell-O, and popsicles. If you do OK with those foods, you can try soft, bland foods, such as plain yogurt. Foods that are high in carbohydrates ("carbs"), like bread or saltine crackers, can help settle your stomach. Some people also find that ginger helps with nausea. You should avoid foods that have a lot of fat in them. They can make nausea worse. Call your doctor if your symptoms come back when you try to eat.  Please follow any educational materials contained in this packet.   Follow-up instructions: Please follow-up with your primary care provider as soon as possible for further evaluation of your symptoms if they are not completely improved.   Return instructions:  Please return to the Emergency Department if you experience  worsening symptoms.  SEEK IMMEDIATE MEDICAL ATTENTION IF: The pain does not go away or becomes severe  A temperature above 101F develops  Repeated vomiting occurs (multiple episodes)  The pain becomes localized to portions of the abdomen. The right side could possibly be appendicitis. In an adult, the left lower portion of the abdomen could be colitis or diverticulitis.  Blood is being passed in stools or vomit (bright red or  black tarry stools)  You develop chest pain, difficulty breathing, dizziness or fainting, or become confused, poorly responsive, or inconsolable (young children) If you have any other emergent concerns regarding your health  Additional Information:   Your vital signs today were: BP 113/78 (BP Location: Left Arm)    Pulse 86    Temp 98.5 F (36.9 C) (Oral)    Resp 18    Ht 4\' 11"  (1.499 m)    Wt 76.2 kg (168 lb)    SpO2 100%    BMI 33.93 kg/m  If your blood pressure (BP) was elevated on multiple readings during this visit above 130 for the top number or above 80 for the bottom number, please have this repeated by your primary care provider within one month. --------------  Thank you for allowing us to participate in your care today.

## 2018-03-10 NOTE — ED Provider Notes (Signed)
[facility] EMERGENCY DEPARTMENT Provider Note   CSN: [id number] Arrival date & time: 03/10/18  1732     History   Chief Complaint Chief Complaint  Patient presents with  . Abdominal Pain    HPI Michele Roberts is a 42 y.o. female.  HPI   Patient is a 41-year female with a history of GERD, asthma, and severe allergic rhinitis presenting for generalized abdominal pain, and skin flushing.  Patient reports that she has had difficulty with abdominal pain for multiple years particularly postprandially and with a "squeezing sensation, however it is been worsening over the past couple weeks after she had improvement.  Patient reports she has a history of visiting gastroenterology, and was diagnosed with gastritis, and evidence of significant acid reflux on EGD.  Patient previously on Nexium, but stopped this due to the prescription running out recently.  Patient reports that she is altered her diet to improve the postprandial pain including cutting out fried food.  Patient reports that she has some abdominal discomfort at present, but her abdomen is not significantly tender.  Patient denies any fevers, chills, nausea, vomiting.  Patient does report that when she has abdominal cramping postprandially, she have multiple episodes of diarrhea throughout the day without melena or hematochezia.  Patient reporting that her main concern is episodes of generalized muscle aches, as well as skin flushing that occurs at random times.  This is been generally worsening over the last couple days.  Patient also notes small maculopapular lesions popping up in different areas of the body.  No palm or sole lesions. Patient denies a history of the same.  Patient denies any recent medication changes, entering menopause, history of thyroid disease.  Last CT scan in 2017, and showed no concerning tumors or lesions for neuroendocrine tumors.  Past Medical History:  Diagnosis Date  . Asthma   . GERD  (gastroesophageal reflux disease)   . Migraines   . Migraines     Patient Active Problem List   Diagnosis Date Noted  . IBS (irritable bowel syndrome) 08/29/2011  . ADJUSTMENT DISORDER WITH DEPRESSED MOOD 08/26/2010  . HELICOBACTER PYLORI INFECTION 07/21/2010  . MIGRAINE, MENSTRUAL 07/21/2010  . GERD 07/21/2010  . THYROID FUNCTION TEST, ABNORMAL 07/21/2010  . ALLERGIC REACTION 07/21/2010    Past Surgical History:  Procedure Laterality Date  . APPENDECTOMY    . CESAREAN SECTION       OB History   None      Home Medications    Prior to Admission medications   Medication Sig Start Date End Date Taking? Authorizing Provider  albuterol (PROVENTIL HFA;VENTOLIN HFA) 108 (90 Base) MCG/ACT inhaler Inhale 2 puffs into the lungs every 6 (six) hours as needed for wheezing or shortness of breath. 11/28/17   Sandford Craze'Sullivan, Melissa, NP  aspirin-acetaminophen-caffeine (EXCEDRIN MIGRAINE) 7626106449250-250-65 MG tablet Take 1 tablet by mouth as needed for headache.    [provider]  azelastine (ASTELIN) 0.1 % nasal spray Place 2 sprays into both nostrils 2 (two) times daily. Use in each nostril as directed 04/26/17   Saguier, Ramon DredgeEdward, PA-C  azithromycin (ZITHROMAX Z-PAK) 250 MG tablet Take 1 tablet (250 mg total) by mouth daily. Take as directed 11/29/17   Cathren LaineSteinl, Kevin, MD  cetirizine (ZYRTEC) 10 MG tablet Take 10 mg by mouth daily.    [provider]  cyclobenzaprine (FLEXERIL) 10 MG tablet Take 1 tablet (10 mg total) by mouth at bedtime. 04/26/17   Saguier, Ramon DredgeEdward, PA-C  fluticasone (FLONASE) 50 MCG/ACT  nasal spray Place 1 spray into both nostrils daily.    [provider]  levonorgestrel (MIRENA) 20 MCG/24HR IUD by Intrauterine route.    [provider]  montelukast (SINGULAIR) 10 MG tablet Take 1 tablet (10 mg total) by mouth at bedtime. 03/01/17   Copland, Gwenlyn Found, MD  omeprazole (PRILOSEC) 40 MG capsule Take 1 capsule (40 mg total) by mouth daily. 11/29/17    Sandford Craze, NP  predniSONE (DELTASONE) 10 MG tablet 4 tabs by mouth once daily for 2 days, then 3 tabs daily x 2 days, then 2 tabs daily x 2 days, then 1 tab daily x 2 days 11/28/17   Sandford Craze, NP  predniSONE (DELTASONE) 20 MG tablet Take 3 tablets (60 mg total) by mouth daily. 11/29/17   Cathren Laine, MD  rizatriptan (MAXALT) 5 MG tablet Take 1 tablet (5 mg total) by mouth as needed. 04/26/17   Saguier, Ramon Dredge, PA-C  traZODone (DESYREL) 50 MG tablet Take 0.5-1 tablets (25-50 mg total) by mouth at bedtime as needed for sleep. 03/01/17   Copland, Gwenlyn Found, MD    Family History Family History  Problem Relation Age of Onset  . Stroke Maternal Grandmother   . Hypertension Neg Hx   . Diabetes Neg Hx   . Cancer Neg Hx     Social History Social History   Tobacco Use  . Smoking status: Never Smoker  . Smokeless tobacco: Never Used  Substance Use Topics  . Alcohol use: No  . Drug use: No     Allergies   Amoxicillin; Banana; Latex; Other; Pineapple; and Shrimp [shellfish allergy]   Review of Systems Review of Systems  Constitutional: Negative for chills and fever.  Respiratory: Negative for shortness of breath.   Cardiovascular: Negative for chest pain.  Gastrointestinal: Positive for abdominal pain, blood in stool and diarrhea. Negative for abdominal distention, nausea and vomiting.  Genitourinary: Negative for dysuria, frequency and urgency.  Musculoskeletal: Positive for myalgias.  Skin: Positive for color change. Negative for rash.  All other systems reviewed and are negative.    Physical Exam Updated Vital Signs BP 113/78 (BP Location: Left Arm)   Pulse 86   Temp 98.5 F (36.9 C) (Oral)   Resp 18   Ht 4\' 11"  (1.499 m)   Wt 76.2 kg (168 lb)   SpO2 100%   BMI 33.93 kg/m   Physical Exam  Constitutional: She appears well-developed and well-nourished. No distress.  HENT:  Head: Normocephalic and atraumatic.  Mouth/Throat: Oropharynx is clear and  moist.  Eyes: Pupils are equal, round, and reactive to light. Conjunctivae and EOM are normal.  Neck: Normal range of motion. Neck supple.  Cardiovascular: Normal rate, regular rhythm, S1 normal and S2 normal.  No murmur heard. Pulmonary/Chest: Effort normal and breath sounds normal. She has no wheezes. She has no rales.  Abdominal: Soft. Bowel sounds are normal. She exhibits no distension. There is no tenderness. There is no guarding.  Abdomen soft and nontender.  Musculoskeletal: Normal range of motion. She exhibits no edema or deformity.  Lymphadenopathy:    She has no cervical adenopathy.  Neurological: She is alert.  Cranial nerves grossly intact. Patient moves extremities symmetrically and with good coordination.  Skin: Skin is warm and dry. No rash noted. There is erythema.  Singular papular eruption of the medial right upper arm. Patient has some minor, patchy erythema of the anterior chest.  Psychiatric: She has a normal mood and affect. Her behavior is normal. Judgment and  thought content normal.  Nursing note and vitals reviewed.    ED Treatments / Results  Labs (all labs ordered are listed, but only abnormal results are displayed) Labs Reviewed  URINALYSIS, ROUTINE W REFLEX MICROSCOPIC - Abnormal; Notable for the following components:      Result Value   Color, Urine STRAW (*)    Specific Gravity, Urine <1.005 (*)    All other components within normal limits  COMPREHENSIVE METABOLIC PANEL - Abnormal; Notable for the following components:   Calcium 8.7 (*)    All other components within normal limits  PREGNANCY, URINE  LIPASE, BLOOD  CBC WITH DIFFERENTIAL/PLATELET    EKG None  Radiology US Abdomen Limited Ruq  Result Date: 03/10/2018 CLINICAL DATA:  Epigastric pain for 2 weeks EXAM: ULTRASOUND ABDOMEN LIMITED RIGHT UPPER QUADRANT COMPARISON:  None. FINDINGS: Gallbladder: No gallstones or wall thickening visualized. No sonographic Murphy sign noted by  sonographer. Common bile duct: Diameter: 3 mm Liver: Mild increased echogenicity is noted consistent with fatty infiltration. An area of focal fatty sparing is noted adjacent to the gallbladder fossa. Portal vein is patent on color Doppler imaging with normal direction of blood flow towards the liver. IMPRESSION: Fatty infiltration of the liver. Electronically Signed   By: Alcide Clever M.D.   On: 03/10/2018 20:39    Procedures Procedures (including critical care time)  Medications Ordered in ED Medications - No data to display   Initial Impression / Assessment and Plan / ED Course  I have reviewed the triage vital signs and the nursing notes.  Pertinent labs & imaging results that were available during my care of the patient were reviewed by me and considered in my medical decision making (see chart for details).     Patient is nontoxic-appearing, afebrile, and in no acute distress.  Abdomen is benign and nonsurgical.  Differential diagnosis for postprandial abdominal pain includes peptic ulcer disease, gastritis, biliary colic.  Given the patient has postprandial discomfort, right upper quadrant ultrasound ordered, which showed no evidence of gallstones or sludge, but did show fatty liver.  This was discussed with patient.  Patient is in the process of re-obtaining primary care as well as reobtaining a gastroenterologist.  Given patient's constellation of symptoms, differential diagnosis for flushing, vomiting, diarrhea includes carcinoid syndrome, thyroid disease, Zollinger-Ellison syndrome.  Patient is not perimenopausal.  Patient also not on any new medications that would cause flushing.  Discussed with patient that she will require outpatient follow-up for further work-up.  No indication for emergent imaging or further lab work at this time.  Patient's basic lab work today is unremarkable.  Patient instructed to follow-up with gastroneurology and primary care.  Gastritis treatment  dispensed.  Patient can return precautions for any intractable nausea or vomiting, worsening abdominal pain, or fever chills with abdominal pain, dizziness, lightheadedness, presyncope, or flushing and rash with other systemic symptoms.  Patient is in understanding and agrees with the plan of care.  Final Clinical Impressions(s) / ED Diagnoses   Final diagnoses:  RUQ pain  Generalized abdominal pain  Flushing    ED Discharge Orders        Ordered    esomeprazole (NEXIUM) 20 MG capsule  Daily     03/10/18 2213    dicyclomine (BENTYL) 20 MG tablet  2 times daily     03/10/18 2213    sucralfate (CARAFATE) 1 g tablet  3 times daily with meals & bedtime     03/10/18 2213  Elisha Ponder, PA-C 03/11/18 0248    Rolan Bucco, MD 03/11/18 1501

## 2018-03-10 NOTE — ED Notes (Signed)
During assessment, pt noticed to have redness around IV insertion. Also has redness to anterior neck

## 2018-03-10 NOTE — ED Triage Notes (Signed)
Patient states that she has problems with acid reflux and has seen a Dr for this - the patient states that she is also having Loose stools after she eats and feels like her stomach is squeezing after she is eating. The patient reports that she is having having small bumps and itching throughout and chills all over body

## 2018-03-31 ENCOUNTER — Emergency Department (HOSPITAL_BASED_OUTPATIENT_CLINIC_OR_DEPARTMENT_OTHER)
Admission: EM | Admit: 2018-03-31 | Discharge: 2018-03-31 | Disposition: A | Discharge status: Home / Self Care | Payer: PRIVATE HEALTH INSURANCE | Attending: Emergency Medicine | Admitting: Emergency Medicine

## 2018-03-31 ENCOUNTER — Encounter (HOSPITAL_BASED_OUTPATIENT_CLINIC_OR_DEPARTMENT_OTHER): Payer: Self-pay | Admitting: Emergency Medicine

## 2018-03-31 ENCOUNTER — Emergency Department (HOSPITAL_BASED_OUTPATIENT_CLINIC_OR_DEPARTMENT_OTHER): Payer: PRIVATE HEALTH INSURANCE

## 2018-03-31 ENCOUNTER — Other Ambulatory Visit: Payer: Self-pay

## 2018-03-31 DIAGNOSIS — Y9389 Activity, other specified: Secondary | ICD-10-CM | POA: Diagnosis not present

## 2018-03-31 DIAGNOSIS — Y998 Other external cause status: Secondary | ICD-10-CM | POA: Diagnosis not present

## 2018-03-31 DIAGNOSIS — J45909 Unspecified asthma, uncomplicated: Secondary | ICD-10-CM | POA: Diagnosis not present

## 2018-03-31 DIAGNOSIS — W07XXXA Fall from chair, initial encounter: Secondary | ICD-10-CM | POA: Insufficient documentation

## 2018-03-31 DIAGNOSIS — F4321 Adjustment disorder with depressed mood: Secondary | ICD-10-CM | POA: Insufficient documentation

## 2018-03-31 DIAGNOSIS — S99921A Unspecified injury of right foot, initial encounter: Secondary | ICD-10-CM | POA: Diagnosis present

## 2018-03-31 DIAGNOSIS — Z79899 Other long term (current) drug therapy: Secondary | ICD-10-CM | POA: Insufficient documentation

## 2018-03-31 DIAGNOSIS — Z91040 Latex allergy status: Secondary | ICD-10-CM | POA: Diagnosis not present

## 2018-03-31 DIAGNOSIS — S93601A Unspecified sprain of right foot, initial encounter: Secondary | ICD-10-CM | POA: Insufficient documentation

## 2018-03-31 DIAGNOSIS — Y929 Unspecified place or not applicable: Secondary | ICD-10-CM | POA: Diagnosis not present

## 2018-03-31 DIAGNOSIS — M79671 Pain in right foot: Secondary | ICD-10-CM

## 2018-03-31 NOTE — ED Provider Notes (Signed)
[facility] EMERGENCY DEPARTMENT Provider Note   CSN: [id number] Arrival date & time: 03/31/18  1816     History   Chief Complaint Chief Complaint  Patient presents with  . Foot Pain    HPI Michele Roberts is a 42 y.o. female with a PMHx of GERD, migraines, asthma, IBS, and other conditions listed below, who presents to the ED with complaints of mechanical fall that occurred about 1.5 hours ago.  Patient states that she was standing on a barstool and was trying to get down off the barstool, slipped and fell, striking her right lateral side/torso and right wrist on the edge of the couch, and then twisting her right foot when she landed.  She denies hitting her head or LOC, denies any pain or injury to her right side/torso or right wrist, but complains of 9/10 constant sharp right foot pain that radiates up the lower leg, which worsens with walking or movement of the foot, and has been unrelieved with ice and ibuprofen.  She has been unable to bear weight in the foot since the injury due to pain.  She reports associated swelling and tingling in the foot.  She denies any pain elsewhere or any other injuries sustained during the incident.  She denies any numbness, focal weakness, bruises, abrasions, abdominal pain, side/torso pain, nausea, vomiting, head inj, LOC, or any other complaints at this time.  She does not have an orthopedist that she sees.  She is not on any blood thinners.  The history is provided by the patient and medical records. No language interpreter was used.  Foot Pain  Pertinent negatives include no abdominal pain.    Past Medical History:  Diagnosis Date  . Asthma   . GERD (gastroesophageal reflux disease)   . Migraines   . Migraines     Patient Active Problem List   Diagnosis Date Noted  . IBS (irritable bowel syndrome) 08/29/2011  . ADJUSTMENT DISORDER WITH DEPRESSED MOOD 08/26/2010  . HELICOBACTER PYLORI INFECTION 07/21/2010  . MIGRAINE, MENSTRUAL  07/21/2010  . GERD 07/21/2010  . THYROID FUNCTION TEST, ABNORMAL 07/21/2010  . ALLERGIC REACTION 07/21/2010    Past Surgical History:  Procedure Laterality Date  . APPENDECTOMY    . CESAREAN SECTION       OB History   None      Home Medications    Prior to Admission medications   Medication Sig Start Date End Date Taking? Authorizing Provider  albuterol (PROVENTIL HFA;VENTOLIN HFA) 108 (90 Base) MCG/ACT inhaler Inhale 2 puffs into the lungs every 6 (six) hours as needed for wheezing or shortness of breath. 11/28/17   Sandford Craze, NP  aspirin-acetaminophen-caffeine (EXCEDRIN MIGRAINE) [phone] MG tablet Take 1 tablet by mouth as needed for headache.    [provider]  azelastine (ASTELIN) 0.1 % nasal spray Place 2 sprays into both nostrils 2 (two) times daily. Use in each nostril as directed 04/26/17   Saguier, Ramon Dredge, PA-C  azithromycin (ZITHROMAX Z-PAK) 250 MG tablet Take 1 tablet (250 mg total) by mouth daily. Take as directed 11/29/17   Cathren Laine, MD  cetirizine (ZYRTEC) 10 MG tablet Take 10 mg by mouth daily.    [provider]  cyclobenzaprine (FLEXERIL) 10 MG tablet Take 1 tablet (10 mg total) by mouth at bedtime. 04/26/17   Saguier, Ramon Dredge, PA-C  dicyclomine (BENTYL) 20 MG tablet Take 1 tablet (20 mg total) by mouth 2 (two) times daily. 03/10/18   Aviva Kluver B, PA-C  esomeprazole (NEXIUM)  20 MG capsule Take 1 capsule (20 mg total) by mouth daily. 03/10/18 04/09/18  Aviva KluverMurray, Alyssa B, PA-C  fluticasone (FLONASE) 50 MCG/ACT nasal spray Place 1 spray into both nostrils daily.    [provider]  levonorgestrel (MIRENA) 20 MCG/24HR IUD by Intrauterine route.    [provider]  montelukast (SINGULAIR) 10 MG tablet Take 1 tablet (10 mg total) by mouth at bedtime. 03/01/17   Copland, Gwenlyn FoundJessica C, MD  omeprazole (PRILOSEC) 40 MG capsule Take 1 capsule (40 mg total) by mouth daily. 11/29/17   Sandford Craze'Sullivan, Melissa, NP  predniSONE (DELTASONE) 10  MG tablet 4 tabs by mouth once daily for 2 days, then 3 tabs daily x 2 days, then 2 tabs daily x 2 days, then 1 tab daily x 2 days 11/28/17   Sandford Craze'Sullivan, Melissa, NP  predniSONE (DELTASONE) 20 MG tablet Take 3 tablets (60 mg total) by mouth daily. 11/29/17   Cathren LaineSteinl, Kevin, MD  rizatriptan (MAXALT) 5 MG tablet Take 1 tablet (5 mg total) by mouth as needed. 04/26/17   Saguier, Ramon DredgeEdward, PA-C  sucralfate (CARAFATE) 1 g tablet Take 1 tablet (1 g total) by mouth 4 (four) times daily -  with meals and at bedtime for 7 days. 03/10/18 03/17/18  Aviva KluverMurray, Alyssa B, PA-C  traZODone (DESYREL) 50 MG tablet Take 0.5-1 tablets (25-50 mg total) by mouth at bedtime as needed for sleep. 03/01/17   Copland, Gwenlyn FoundJessica C, MD    Family History Family History  Problem Relation Age of Onset  . Stroke Maternal Grandmother   . Hypertension Neg Hx   . Diabetes Neg Hx   . Cancer Neg Hx     Social History Social History   Tobacco Use  . Smoking status: Never Smoker  . Smokeless tobacco: Never Used  Substance Use Topics  . Alcohol use: No  . Drug use: No     Allergies   Amoxicillin; Banana; Latex; Other; Pineapple; and Shrimp [shellfish allergy]   Review of Systems Review of Systems  HENT: Negative for facial swelling (no head inj).   Gastrointestinal: Negative for abdominal pain, nausea and vomiting.  Musculoskeletal: Positive for arthralgias and joint swelling.  Skin: Negative for color change and wound.  Allergic/Immunologic: Negative for immunocompromised state.  Neurological: Negative for syncope, weakness and numbness.  Hematological: Does not bruise/bleed easily.  Psychiatric/Behavioral: Negative for confusion.     Physical Exam Updated Vital Signs BP 124/86 (BP Location: Right Arm)   Pulse 95   Temp 98.5 F (36.9 C) (Oral)   Resp 16   Ht 4\' 11"  (1.499 m)   Wt 76.2 kg (168 lb)   SpO2 100%   BMI 33.93 kg/m   Physical Exam  Constitutional: She is oriented to person, place, and time. Vital signs  are normal. She appears well-developed and well-nourished.  Non-toxic appearance. No distress.  Afebrile, nontoxic, NAD  HENT:  Head: Normocephalic and atraumatic.  Mouth/Throat: Mucous membranes are normal.  Eyes: Conjunctivae and EOM are normal. Right eye exhibits no discharge. Left eye exhibits no discharge.  Neck: Normal range of motion. Neck supple.  Cardiovascular: Normal rate and intact distal pulses.  Pulmonary/Chest: Effort normal. No respiratory distress. She exhibits no tenderness, no crepitus, no deformity and no retraction.  No chest wall TTP or bruising, no crepitus or deformities, no retractions  Abdominal: Normal appearance. She exhibits no distension. There is no tenderness. There is no rigidity, no rebound and no guarding.  Soft, NTND, no r/g/r, no bruising  Musculoskeletal: Normal range  of motion.       Right wrist: Normal.       Right ankle: Normal.       Right foot: There is tenderness, bony tenderness and swelling. There is normal range of motion, normal capillary refill, no crepitus, no deformity and no laceration.       Feet:  R wrist with FROM intact and no focal bony/joint line TTP, no crepitus or deformity, no bruising or evidence of injury. R foot and ankle with FROM intact and wiggling all toes (although this does elicit pain), with mild swelling across mid-foot but none in the ankle or remainder of leg/calf, no crepitus or deformity, with moderate TTP across the midfoot but no focal bony/joint line TTP of the ankle/malleoli or rest of leg/calf. No break in skin. No bruising or erythema. No warmth. Achilles intact. Good pedal pulse and cap refill of all toes. Wiggling toes although this elicits pain in the foot, but able to fully move all toes. Sensation grossly intact. Soft compartments   Neurological: She is alert and oriented to person, place, and time. She has normal strength. No sensory deficit.  Skin: Skin is warm, dry and intact. No rash noted.  Psychiatric:  She has a normal mood and affect. Her behavior is normal.  Nursing note and vitals reviewed.    ED Treatments / Results  Labs (all labs ordered are listed, but only abnormal results are displayed) Labs Reviewed - No data to display  EKG None  Radiology Dg Foot Complete Right  Result Date: 03/31/2018 CLINICAL DATA:  Right foot pain after fall. EXAM: RIGHT FOOT COMPLETE - 3+ VIEW COMPARISON:  None. FINDINGS: There is no evidence of fracture or dislocation. There is no evidence of arthropathy or other focal bone abnormality. Soft tissues are unremarkable. IMPRESSION: Normal right foot. Electronically Signed   By: Lupita Raider, M.D.   On: 03/31/2018 19:19    Procedures Procedures (including critical care time)  Medications Ordered in ED Medications - No data to display   Initial Impression / Assessment and Plan / ED Course  I have reviewed the triage vital signs and the nursing notes.  Pertinent labs & imaging results that were available during my care of the patient were reviewed by me and considered in my medical decision making (see chart for details).     42 y.o. female here with R foot pain after mechanical fall today. On exam, R foot with mild swelling along dorsum and moderate TTP across mid-foot, no focal bony/joint line TTP in ankle or rest of leg, no bruising or wounds, NVI with soft compartments, wiggles toes though this causes pain. Xray negative for fracture, however given that she is unable to weight bear due to pain, I suspect there's a possibility of occult fx. Will treat conservatively as possible metatarsal fx, will give CAM walker and crutches, and have her f/up with ortho in 1wk. Advised RICE, and tylenol/motrin for pain. Doubt need for further emergent work up at this time. I explained the diagnosis and have given explicit precautions to return to the ER including for any other new or worsening symptoms. The patient understands and accepts the medical plan as it's  been dictated and I have answered their questions. Discharge instructions concerning home care and prescriptions have been given. The patient is STABLE and is discharged to home in good condition.    Final Clinical Impressions(s) / ED Diagnoses   Final diagnoses:  Right foot pain  Sprain of right  foot, initial encounter    ED Discharge Orders    [address] [place], [place] 03/31/18 1952    Charlynne Pander, MD 03/31/18 2352

## 2018-03-31 NOTE — ED Triage Notes (Signed)
Patient states that she was sitting on a chair that was higher up - the patient reports that she fell hitting her right side and her right foot.

## 2018-03-31 NOTE — Discharge Instructions (Signed)
Your xray does not show a definite fracture, however we're treating you as if you have a possible fracture because sometimes xrays don't show fractures in the feet. Wear CAM walker for all weight bearing activities until you see the orthopedist. Use crutches as needed for comfort. Ice and elevate foot throughout the day, using ice pack for no more than 20 minutes every hour.  Alternate between tylenol and ibuprofen as needed for pain relief. Follow up with the orthopedist in 5-7 days for recheck of symptoms and ongoing management of your foot injury. Return to the ER for changes or worsening symptoms.

## 2018-09-16 NOTE — Progress Notes (Deleted)
[facility] [address], [address] [place], [place] [phone] [phone]  Date:  09/19/2018   Name:  Michele MontesMarilin Roberts   DOB:  08/06/1976   MRN:  [id number]  PCP:  Pearline Cablesopland, Darroll Bredeson C, MD    Chief Complaint: No chief complaint on file.   History of Present Illness:  Michele Roberts is a 42 y.o. very pleasant female patient who presents with the following:  CPE today Last seen by myself 5/18 History of IBS, GERD, thyroid disorder but not currently on any meds for same, depression  Pap: Mammo: ? Labs: CMP and CBC done in june Immun: Tdap/ flu  Lab Results  Component Value Date   TSH 1.65 03/01/2017    From our last visit: She does have a GYN doctor who does her pap screening.  She 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  Between work and school and her children (she is a single mom) things are quite busy for her, and can feel overwhelming Her daughters are 2023, 8816, and 44 yo She also has her nephew living with him right now- he will be graduating HS next year  Patient Active Problem List   Diagnosis Date Noted  . IBS (irritable bowel syndrome) 08/29/2011  . ADJUSTMENT DISORDER WITH DEPRESSED MOOD 08/26/2010  . HELICOBACTER PYLORI INFECTION 07/21/2010  . MIGRAINE, MENSTRUAL 07/21/2010  . GERD 07/21/2010  . THYROID FUNCTION TEST, ABNORMAL 07/21/2010  . ALLERGIC REACTION 07/21/2010    Past Medical History:  Diagnosis Date  . Asthma   . GERD (gastroesophageal reflux disease)   . Migraines   . Migraines     Past Surgical History:  Procedure Laterality Date  . APPENDECTOMY    . CESAREAN  SECTION      Social History   Tobacco Use  . Smoking status: Never Smoker  . Smokeless tobacco: Never Used  Substance Use Topics  . Alcohol use: No  . Drug use: No    Family History  Problem Relation Age of Onset  . Stroke Maternal Grandmother   . Hypertension Neg Hx   . Diabetes Neg Hx   . Cancer Neg Hx     Allergies  Allergen Reactions  . Amoxicillin     Burns skin  . Banana     Throat itches  . Latex     Burns skin  . Other     Dust Mites; Dogs, Cats  . Pineapple     Throat itches  . Shrimp [Shellfish Allergy]     Medication list has been reviewed and updated.  Current Outpatient Medications on File Prior to Visit  Medication Sig Dispense Refill  . albuterol (PROVENTIL HFA;VENTOLIN HFA) 108 (90 Base) MCG/ACT inhaler Inhale 2 puffs into the lungs every 6 (six) hours as needed for wheezing or shortness of breath. 1 Inhaler 0  . aspirin-acetaminophen-caffeine (EXCEDRIN MIGRAINE) 250-250-65 MG tablet Take 1 tablet by mouth as needed for headache.    Marland Kitchen. azelastine (ASTELIN) 0.1 % nasal spray Place 2 sprays into both nostrils 2 (two) times daily. Use in each nostril as  directed 30 mL 2  . azithromycin (ZITHROMAX Z-PAK) 250 MG tablet Take 1 tablet (250 mg total) by mouth daily. Take as directed 4 tablet 0  . cetirizine (ZYRTEC) 10 MG tablet Take 10 mg by mouth daily.    . cyclobenzaprine (FLEXERIL) 10 MG tablet Take 1 tablet (10 mg total) by mouth at bedtime. 7 tablet 0  . dicyclomine (BENTYL) 20 MG tablet Take 1 tablet (20 mg total) by mouth 2 (two) times daily. 20 tablet 0  . esomeprazole (NEXIUM) 20 MG capsule Take 1 capsule (20 mg total) by mouth daily. 30 capsule 0  . fluticasone (FLONASE) 50 MCG/ACT nasal spray Place 1 spray into both nostrils daily.    Marland Kitchen levonorgestrel (MIRENA) 20 MCG/24HR IUD by Intrauterine route.    . montelukast (SINGULAIR) 10 MG tablet Take 1 tablet (10 mg total) by mouth at bedtime. 30 tablet 6  . omeprazole (PRILOSEC) 40 MG capsule Take 1  capsule (40 mg total) by mouth daily. 30 capsule 3  . predniSONE (DELTASONE) 10 MG tablet 4 tabs by mouth once daily for 2 days, then 3 tabs daily x 2 days, then 2 tabs daily x 2 days, then 1 tab daily x 2 days 20 tablet 0  . predniSONE (DELTASONE) 20 MG tablet Take 3 tablets (60 mg total) by mouth daily. 12 tablet 0  . rizatriptan (MAXALT) 5 MG tablet Take 1 tablet (5 mg total) by mouth as needed. 10 tablet 0  . sucralfate (CARAFATE) 1 g tablet Take 1 tablet (1 g total) by mouth 4 (four) times daily -  with meals and at bedtime for 7 days. 28 tablet 0  . traZODone (DESYREL) 50 MG tablet Take 0.5-1 tablets (25-50 mg total) by mouth at bedtime as needed for sleep. 30 tablet 6   No current facility-administered medications on file prior to visit.     Review of Systems:  As per HPI- otherwise negative.    Physical Examination: There were no vitals filed for this visit. There were no vitals filed for this visit. There is no height or weight on file to calculate BMI. Ideal Body Weight:    GEN: WDWN, NAD, Non-toxic, A & O x 3 HEENT: Atraumatic, Normocephalic. Neck supple. No masses, No LAD. Ears and Nose: No external deformity. CV: RRR, No M/G/R. No JVD. No thrill. No extra heart sounds. PULM: CTA B, no wheezes, crackles, rhonchi. No retractions. No resp. distress. No accessory muscle use. ABD: S, NT, ND, +BS. No rebound. No HSM. EXTR: No c/c/e NEURO Normal gait.  PSYCH: Normally interactive. Conversant. Not depressed or anxious appearing.  Calm demeanor.    Assessment and Plan: ***  Signed Abbe Amsterdam, MD

## 2018-09-19 ENCOUNTER — Encounter: Payer: PRIVATE HEALTH INSURANCE | Admitting: Family Medicine

## 2018-10-14 NOTE — Progress Notes (Addendum)
[facility] [address], [address] [place], [place] [address] [phone] [phone]  Date:  10/17/2018   Name:  Michele Roberts   DOB:  11/19/1975   MRN:  [id number]  PCP:  Pearline Cablesopland, Kimberley Dastrup C, MD    Chief Complaint: Annual Exam (PAP? )   History of Present Illness:  Michele Roberts is a 43 y.o. very pleasant female patient who presents with the following:  History of asthma, IBS, depression.  Only previous visit with myself was in May 2018, here today for a complete physical  Labs: Due for full labs today.  She is fasting today  Pap: per Dr. 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  Between work and school and her children (she is a single mom) things are quite busy for her, and can feel overwhelming Her daughters are 4223, 3016, and 45 yo She also has her nephew living with him right now- he will be graduating HS next year.    Her youngest is now 6914, her middle is in college, her oldest is 45 yo and is employed in [employer] Her nephew was able to return home Overall she is facing a lot less stress at home.  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  She may either try to go to the LPN route, or get a surgical tech certification  She is seeing GI for her IBS They did endo and colon, all ok per her report She is on nexium She is trying to follow a lower carb plan and lost weight, this also helps her stomach to have less bloating  She has a mirena IUD in place and likes this  She notes that overall she is doing pretty  well She needs a refill of her maxalt that she uses for migraine HA  She generally has a headache about once a month, but had a longer-lasting headache last week.  This is now resolved  We had used trazodone last time for insomnia- she ran out of this but would like to go back on this for occasional use She needs a refill of her singulair as well  She did have a fall last year, but this occurred when she was standing on a chair attempting to hang a picture  She has never been a smoker, does not drink alcohol.  She is exercising 3-4 times a week with her group of friends.  They do a mixture of cardio and weight training Wt Readings from Last 3 Encounters:  10/17/18 160 lb (72.6 kg)  03/31/18 168 lb (76.2 kg)  03/10/18 168 lb (76.2 kg)     Patient Active Problem List   Diagnosis Date Noted  . IBS (irritable bowel syndrome) 08/29/2011  . ADJUSTMENT DISORDER WITH DEPRESSED MOOD 08/26/2010  . HELICOBACTER PYLORI INFECTION 07/21/2010  . MIGRAINE, MENSTRUAL 07/21/2010  . GERD 07/21/2010  . THYROID FUNCTION TEST, ABNORMAL 07/21/2010  . ALLERGIC REACTION 07/21/2010    Past Medical  History:  Diagnosis Date  . Asthma   . GERD (gastroesophageal reflux disease)   . Migraines   . Migraines     Past Surgical History:  Procedure Laterality Date  . APPENDECTOMY    . CESAREAN SECTION      Social History   Tobacco Use  . Smoking status: Never Smoker  . Smokeless tobacco: Never Used  Substance Use Topics  . Alcohol use: No  . Drug use: No    Family History  Problem Relation Age of Onset  . Stroke Maternal Grandmother   . Hypertension Neg Hx   . Diabetes Neg Hx   . Cancer Neg Hx     Allergies  Allergen Reactions  . Amoxicillin     Burns skin  . Banana     Throat itches  . Latex     Burns skin  . Other     Dust Mites; Dogs, Cats  . Pineapple     Throat itches  . Shrimp [Shellfish Allergy]     Medication list has been reviewed and updated.  Current Outpatient  Medications on File Prior to Visit  Medication Sig Dispense Refill  . albuterol (PROVENTIL HFA;VENTOLIN HFA) 108 (90 Base) MCG/ACT inhaler Inhale 2 puffs into the lungs every 6 (six) hours as needed for wheezing or shortness of breath. 1 Inhaler 0  . aspirin-acetaminophen-caffeine (EXCEDRIN MIGRAINE) 250-250-65 MG tablet Take 1 tablet by mouth as needed for headache.    . cetirizine (ZYRTEC) 10 MG tablet Take 10 mg by mouth daily.    . cyclobenzaprine (FLEXERIL) 10 MG tablet Take 1 tablet (10 mg total) by mouth at bedtime. 7 tablet 0  . dicyclomine (BENTYL) 20 MG tablet Take 1 tablet (20 mg total) by mouth 2 (two) times daily. 20 tablet 0  . levonorgestrel (MIRENA) 20 MCG/24HR IUD by Intrauterine route.    . montelukast (SINGULAIR) 10 MG tablet Take 1 tablet (10 mg total) by mouth at bedtime. 30 tablet 6  . rizatriptan (MAXALT) 5 MG tablet Take 1 tablet (5 mg total) by mouth as needed. 10 tablet 0  . traZODone (DESYREL) 50 MG tablet Take 0.5-1 tablets (25-50 mg total) by mouth at bedtime as needed for sleep. 30 tablet 6  . fluticasone (FLONASE) 50 MCG/ACT nasal spray Place 1 spray into both nostrils daily.    . sucralfate (CARAFATE) 1 g tablet Take 1 tablet (1 g total) by mouth 4 (four) times daily -  with meals and at bedtime for 7 days. 28 tablet 0   No current facility-administered medications on file prior to visit.     Review of Systems:  As per HPI- otherwise negative. No fever chills, no chest pain or shortness of breath  Physical Examination: Vitals:   10/17/18 1007  BP: 112/72  Pulse: 87  Resp: 16  SpO2: 99%   Vitals:   10/17/18 1007  Weight: 160 lb (72.6 kg)  Height: 4\' 11"  (1.499 m)   Body mass index is 32.32 kg/m. Ideal Body Weight: Weight in (lb) to have BMI = 25: 123.5  GEN: WDWN, NAD, Non-toxic, A & O x 3 HEENT: Atraumatic, Normocephalic. Neck supple. No masses, No LAD. Ears and Nose: No external deformity. CV: RRR, No M/G/R. No JVD. No thrill. No extra heart  sounds. PULM: CTA B, no wheezes, crackles, rhonchi. No retractions. No resp. distress. No accessory muscle use. ABD: S, NT, ND, +BS. No rebound. No HSM. EXTR: No c/c/e NEURO Normal gait.  PSYCH: Normally interactive. Conversant. Not  depressed or anxious appearing.  Calm demeanor.    Assessment and Plan: Physical exam  Screening for deficiency anemia - Plan: CBC  Screening for diabetes mellitus - Plan: Comprehensive metabolic panel, Hemoglobin A1c  Screening for hyperlipidemia - Plan: Lipid panel  Non-seasonal allergic rhinitis, unspecified trigger - Plan: montelukast (SINGULAIR) 10 MG tablet, fluticasone (FLONASE) 50 MCG/ACT nasal spray  Difficulty sleeping - Plan: traZODone (DESYREL) 50 MG tablet  Migraine without status migrainosus, not intractable, unspecified migraine type - Plan: rizatriptan (MAXALT) 5 MG tablet  Here today for complete physical.  She is doing quite well today, home stressors have greatly reduced.  Her children are all doing well, her youngest who had been troubled is doing much better.  Refilled her medications as above. Discussed possibility of serotonin syndrome with combination of trazodone and Maxalt.  I am not overly concerned about this as her doses of both are low, but did discuss with her. Encouraged continued exercise and diet with goal of achieving her ideal weight. I will be in touch pending her labs  Signed Abbe Amsterdam, MD  Received her labs, letter to patient  Results for orders placed or performed in visit on 10/17/18  CBC  Result Value Ref Range   WBC 7.8 4.0 - 10.5 K/uL   RBC 4.32 3.87 - 5.11 Mil/uL   Platelets 281.0 150.0 - 400.0 K/uL   Hemoglobin 11.9 (L) 12.0 - 15.0 g/dL   HCT 93.7 16.9 - 67.8 %   MCV 84.3 78.0 - 100.0 fl   MCHC 32.7 30.0 - 36.0 g/dL   RDW 93.8 10.1 - 75.1 %  Comprehensive metabolic panel  Result Value Ref Range   Sodium 137 135 - 145 mEq/L   Potassium 4.4 3.5 - 5.1 mEq/L   Chloride 106 96 - 112 mEq/L    CO2 26 19 - 32 mEq/L   Glucose, Bld 94 70 - 99 mg/dL   BUN 9 6 - 23 mg/dL   Creatinine, Ser 0.25 0.40 - 1.20 mg/dL   Total Bilirubin 0.6 0.2 - 1.2 mg/dL   Alkaline Phosphatase 53 39 - 117 U/L   AST 26 0 - 37 U/L   ALT 28 0 - 35 U/L   Total Protein 6.2 6.0 - 8.3 g/dL   Albumin 3.9 3.5 - 5.2 g/dL   Calcium 9.0 8.4 - 85.2 mg/dL   GFR 778.24 >23.53 mL/min  Hemoglobin A1c  Result Value Ref Range   Hgb A1c MFr Bld 5.8 4.6 - 6.5 %  Lipid panel  Result Value Ref Range   Cholesterol 186 0 - 200 mg/dL   Triglycerides 61.4 0.0 - 149.0 mg/dL   HDL 43.15 >40.08 mg/dL   VLDL 7.8 0.0 - 67.6 mg/dL   LDL Cholesterol 195 (H) 0 - 99 mg/dL   Total CHOL/HDL Ratio 3    NonHDL 132.09     Cholesterol is stable, A1c is slightly improved

## 2018-10-17 ENCOUNTER — Encounter: Payer: Self-pay | Admitting: Family Medicine

## 2018-10-17 ENCOUNTER — Ambulatory Visit (INDEPENDENT_AMBULATORY_CARE_PROVIDER_SITE_OTHER): Payer: PRIVATE HEALTH INSURANCE | Admitting: Family Medicine

## 2018-10-17 VITALS — BP 112/72 | HR 87 | Resp 16 | Ht 59.0 in | Wt 160.0 lb

## 2018-10-17 DIAGNOSIS — G479 Sleep disorder, unspecified: Secondary | ICD-10-CM

## 2018-10-17 DIAGNOSIS — Z13220 Encounter for screening for lipoid disorders: Secondary | ICD-10-CM

## 2018-10-17 DIAGNOSIS — Z130 Encounter for screening for diseases of the blood and blood-forming organs and certain disorders involving the immune mechanism: Secondary | ICD-10-CM

## 2018-10-17 DIAGNOSIS — Z131 Encounter for screening for diabetes mellitus: Secondary | ICD-10-CM

## 2018-10-17 DIAGNOSIS — Z0000 Encounter for general adult medical examination without abnormal findings: Secondary | ICD-10-CM

## 2018-10-17 DIAGNOSIS — J3089 Other allergic rhinitis: Secondary | ICD-10-CM

## 2018-10-17 DIAGNOSIS — G43909 Migraine, unspecified, not intractable, without status migrainosus: Secondary | ICD-10-CM

## 2018-10-17 LAB — LIPID PANEL
CHOL/HDL RATIO: 3
CHOLESTEROL: 186 mg/dL (ref 0–200)
HDL: 54.1 mg/dL (ref >39.00)
LDL Cholesterol: 124 mg/dL — ABNORMAL HIGH (ref 0–99)
NONHDL: 132.09
TRIGLYCERIDES: 39 mg/dL (ref 0.0–149.0)
VLDL: 7.8 mg/dL (ref 0.0–40.0)

## 2018-10-17 LAB — CBC
HCT: 36.4 % (ref 36.0–46.0)
HEMOGLOBIN: 11.9 g/dL — AB (ref 12.0–15.0)
MCHC: 32.7 g/dL (ref 30.0–36.0)
MCV: 84.3 fl (ref 78.0–100.0)
PLATELETS: 281 10*3/uL (ref 150.0–400.0)
RBC: 4.32 Mil/uL (ref 3.87–5.11)
RDW: 13.6 % (ref 11.5–15.5)
WBC: 7.8 10*3/uL (ref 4.0–10.5)

## 2018-10-17 LAB — COMPREHENSIVE METABOLIC PANEL
ALK PHOS: 53 U/L (ref 39–117)
ALT: 28 U/L (ref 0–35)
AST: 26 U/L (ref 0–37)
Albumin: 3.9 g/dL (ref 3.5–5.2)
BUN: 9 mg/dL (ref 6–23)
CALCIUM: 9 mg/dL (ref 8.4–10.5)
CO2: 26 mEq/L (ref 19–32)
CREATININE: 0.59 mg/dL (ref 0.40–1.20)
Chloride: 106 mEq/L (ref 96–112)
GFR: 117.6 mL/min (ref >60.00)
GLUCOSE: 94 mg/dL (ref 70–99)
Potassium: 4.4 mEq/L (ref 3.5–5.1)
Sodium: 137 mEq/L (ref 135–145)
Total Bilirubin: 0.6 mg/dL (ref 0.2–1.2)
Total Protein: 6.2 g/dL (ref 6.0–8.3)

## 2018-10-17 LAB — HEMOGLOBIN A1C: Hgb A1c MFr Bld: 5.8 % (ref 4.6–6.5)

## 2018-10-17 MED ORDER — FLUTICASONE PROPIONATE 50 MCG/ACT NA SUSP: 1.0000 | Freq: Every day | NASAL | 3 refills | Status: AC

## 2018-10-17 MED ORDER — MONTELUKAST SODIUM 10 MG PO TABS: 10.0000 mg | ORAL_TABLET | Freq: Every day | ORAL | 3 refills | Status: AC

## 2018-10-17 MED ORDER — RIZATRIPTAN BENZOATE 5 MG PO TABS: 5.0000 mg | ORAL_TABLET | ORAL | 0 refills | Status: AC | PRN

## 2018-10-17 MED ORDER — TRAZODONE HCL 50 MG PO TABS: 25.0000 mg | ORAL_TABLET | Freq: Every evening | ORAL | 6 refills | Status: AC | PRN

## 2018-10-17 NOTE — Patient Instructions (Signed)
It was nice to see you today, I am glad that things have calmed down for you at home. 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  I will be in touch with your labs ASAP. I gave you some trazodone to use as needed for sleep.  The trazodone and Maxalt both can increase your serotonin levels, which could put you at risk for serotonin syndrome.  This is relatively rare and I think unlikely given the low doses of both medications that you are using.  However, be alert for any unusual symptoms.  I have provided list of the symptoms of serotonin syndrome below  Agitation or restlessness Confusion Rapid heart rate and high blood pressure Dilated pupils Loss of muscle coordination or twitching muscles Muscle rigidity Heavy sweating Diarrhea Headache Shivering Goose bumps  Severe serotonin syndrome can be life-threatening. Signs include:  High fever Seizures Irregular heartbeat Unconsciousness  Health Maintenance, Female Adopting a healthy lifestyle and getting preventive care can go a long way to promote health and wellness. Talk with your health care provider about what schedule of regular examinations is right for you. This is a good chance for you to check in with your provider about disease prevention and staying healthy. In between checkups, there are plenty of things you can do on your own. Experts have done a lot of research about which lifestyle changes and preventive measures are most likely to keep you healthy. Ask your health care provider for more information. Weight and diet Eat a healthy diet  Be sure to include plenty of vegetables, fruits, low-fat dairy products, and lean protein.  Do not eat a lot of foods high in solid fats, added sugars, or salt.  Get regular exercise. This is one of the most important things you can do for your health. ? Most adults should exercise for at least 150 minutes each week. The exercise should increase your heart rate and make you sweat  (moderate-intensity exercise). ? Most adults should also do strengthening exercises at least twice a week. This is in addition to the moderate-intensity exercise. Maintain a healthy weight  Body mass index (BMI) is a measurement that can be used to identify possible weight problems. It estimates body fat based on height and weight. Your health care provider can help determine your BMI and help you achieve or maintain a healthy weight.  For females 18 years of age and older: ? A BMI below 18.5 is considered underweight. ? A BMI of 18.5 to 24.9 is normal. ? A BMI of 25 to 29.9 is considered overweight. ? A BMI of 30 and above is considered obese. Watch levels of cholesterol and blood lipids  You should start having your blood tested for lipids and cholesterol at 46 years of age, then have this test every 5 years.  You may need to have your cholesterol levels checked more often if: ? Your lipid or cholesterol levels are high. ? You are older than 45 years of age. ? You are at high risk for heart disease. Cancer screening Lung Cancer  Lung cancer screening is recommended for adults 64-61 years old who are at high risk for lung cancer because of a history of smoking.  A yearly low-dose CT scan of the lungs is recommended for people who: ? Currently smoke. ? Have quit within the past 15 years. ? Have at least a 30-pack-year history of smoking. A pack year is smoking an average of one pack of cigarettes a day for 1  year.  Yearly screening should continue until it has been 15 years since you quit.  Yearly screening should stop if you develop a health problem that would prevent you from having lung cancer treatment. Breast Cancer  Practice breast self-awareness. This means understanding how your breasts normally appear and feel.  It also means doing regular breast self-exams. Let your health care provider know about any changes, no matter how small.  If you are in your 20s or 30s, you  should have a clinical breast exam (CBE) by a health care provider every 1-3 years as part of a regular health exam.  If you are 66 or older, have a CBE every year. Also consider having a breast X-ray (mammogram) every year.  If you have a family history of breast cancer, talk to your health care provider about genetic screening.  If you are at high risk for breast cancer, talk to your health care provider about having an MRI and a mammogram every year.  Breast cancer gene (BRCA) assessment is recommended for women who have family members with BRCA-related cancers. BRCA-related cancers include: ? Breast. ? Ovarian. ? Tubal. ? Peritoneal cancers.  Results of the assessment will determine the need for genetic counseling and BRCA1 and BRCA2 testing. Cervical Cancer Your health care provider may recommend that you be screened regularly for cancer of the pelvic organs (ovaries, uterus, and vagina). This screening involves a pelvic examination, including checking for microscopic changes to the surface of your cervix (Pap test). You may be encouraged to have this screening done every 3 years, beginning at age 85.  For women ages 32-65, health care providers may recommend pelvic exams and Pap testing every 3 years, or they may recommend the Pap and pelvic exam, combined with testing for human papilloma virus (HPV), every 5 years. Some types of HPV increase your risk of cervical cancer. Testing for HPV may also be done on women of any age with unclear Pap test results.  Other health care providers may not recommend any screening for nonpregnant women who are considered low risk for pelvic cancer and who do not have symptoms. Ask your health care provider if a screening pelvic exam is right for you.  If you have had past treatment for cervical cancer or a condition that could lead to cancer, you need Pap tests and screening for cancer for at least 20 years after your treatment. If Pap tests have been  discontinued, your risk factors (such as having a new sexual partner) need to be reassessed to determine if screening should resume. Some women have medical problems that increase the chance of getting cervical cancer. In these cases, your health care provider may recommend more frequent screening and Pap tests. Colorectal Cancer  This type of cancer can be detected and often prevented.  Routine colorectal cancer screening usually begins at 45 years of age and continues through 45 years of age.  Your health care provider may recommend screening at an earlier age if you have risk factors for colon cancer.  Your health care provider may also recommend using home test kits to check for hidden blood in the stool.  A small camera at the end of a tube can be used to examine your colon directly (sigmoidoscopy or colonoscopy). This is done to check for the earliest forms of colorectal cancer.  Routine screening usually begins at age 23.  Direct examination of the colon should be repeated every 5-10 years through 45 years of age. However,  you may need to be screened more often if early forms of precancerous polyps or small growths are found. Skin Cancer  Check your skin from head to toe regularly.  Tell your health care provider about any new moles or changes in moles, especially if there is a change in a mole's shape or color.  Also tell your health care provider if you have a mole that is larger than the size of a pencil eraser.  Always use sunscreen. Apply sunscreen liberally and repeatedly throughout the day.  Protect yourself by wearing long sleeves, pants, a wide-brimmed hat, and sunglasses whenever you are outside. Heart disease, diabetes, and high blood pressure  High blood pressure causes heart disease and increases the risk of stroke. High blood pressure is more likely to develop in: ? People who have blood pressure in the high end of the normal range (130-139/85-89 mm Hg). ? People  who are overweight or obese. ? People who are African American.  If you are 70-66 years of age, have your blood pressure checked every 3-5 years. If you are 85 years of age or older, have your blood pressure checked every year. You should have your blood pressure measured twice-once when you are at a hospital or clinic, and once when you are not at a hospital or clinic. Record the average of the two measurements. To check your blood pressure when you are not at a hospital or clinic, you can use: ? An automated blood pressure machine at a pharmacy. ? A home blood pressure monitor.  If you are between 67 years and 25 years old, ask your health care provider if you should take aspirin to prevent strokes.  Have regular diabetes screenings. This involves taking a blood sample to check your fasting blood sugar level. ? If you are at a normal weight and have a low risk for diabetes, have this test once every three years after 45 years of age. ? If you are overweight and have a high risk for diabetes, consider being tested at a younger age or more often. Preventing infection Hepatitis B  If you have a higher risk for hepatitis B, you should be screened for this virus. You are considered at high risk for hepatitis B if: ? You were born in a country where hepatitis B is common. Ask your health care provider which countries are considered high risk. ? Your parents were born in a high-risk country, and you have not been immunized against hepatitis B (hepatitis B vaccine). ? You have HIV or AIDS. ? You use needles to inject street drugs. ? You live with someone who has hepatitis B. ? You have had sex with someone who has hepatitis B. ? You get hemodialysis treatment. ? You take certain medicines for conditions, including cancer, organ transplantation, and autoimmune conditions. Hepatitis C  Blood testing is recommended for: ? Everyone born from [birth year] through 1967. ? Anyone with known risk factors for  hepatitis C. Sexually transmitted infections (STIs)  You should be screened for sexually transmitted infections (STIs) including gonorrhea and chlamydia if: ? You are sexually active and are younger than 45 years of age. ? You are older than 45 years of age and your health care provider tells you that you are at risk for this type of infection. ? Your sexual activity has changed since you were last screened and you are at an increased risk for chlamydia or gonorrhea. Ask your health care provider if you are at risk.  If you do not have HIV, but are at risk, it may be recommended that you take a prescription medicine daily to prevent HIV infection. This is called pre-exposure prophylaxis (PrEP). You are considered at risk if: ? You are sexually active and do not regularly use condoms or know the HIV status of your partner(s). ? You take drugs by injection. ? You are sexually active with a partner who has HIV. Talk with your health care provider about whether you are at high risk of being infected with HIV. If you choose to begin PrEP, you should first be tested for HIV. You should then be tested every 3 months for as long as you are taking PrEP. Pregnancy  If you are premenopausal and you may become pregnant, ask your health care provider about preconception counseling.  If you may become pregnant, take 400 to 800 micrograms (mcg) of folic acid every day.  If you want to prevent pregnancy, talk to your health care provider about birth control (contraception). Osteoporosis and menopause  Osteoporosis is a disease in which the bones lose minerals and strength with aging. This can result in serious bone fractures. Your risk for osteoporosis can be identified using a bone density scan.  If you are 61 years of age or older, or if you are at risk for osteoporosis and fractures, ask your health care provider if you should be screened.  Ask your health care provider whether you should take a calcium  or vitamin D supplement to lower your risk for osteoporosis.  Menopause may have certain physical symptoms and risks.  Hormone replacement therapy may reduce some of these symptoms and risks. Talk to your health care provider about whether hormone replacement therapy is right for you. Follow these instructions at home:  Schedule regular health, dental, and eye exams.  Stay current with your immunizations.  Do not use any tobacco products including cigarettes, chewing tobacco, or electronic cigarettes.  If you are pregnant, do not drink alcohol.  If you are breastfeeding, limit how much and how often you drink alcohol.  Limit alcohol intake to no more than 1 drink per day for nonpregnant women. One drink equals 12 ounces of beer, 5 ounces of wine, or 1 ounces of hard liquor.  Do not use street drugs.  Do not share needles.  Ask your health care provider for help if you need support or information about quitting drugs.  Tell your health care provider if you often feel depressed.  Tell your health care provider if you have ever been abused or do not feel safe at home. This information is not intended to replace advice given to you by your health care provider. Make sure you discuss any questions you have with your health care provider. Document Released: 04/11/2011 Document Revised: 03/03/2016 Document Reviewed: 06/30/2015 Elsevier Interactive Patient Education  2019 [employer].

## 2018-11-13 ENCOUNTER — Telehealth: Payer: Self-pay

## 2018-11-13 MED ORDER — OMEPRAZOLE 40 MG PO CPDR: 40.0000 mg | DELAYED_RELEASE_CAPSULE | Freq: Every day | ORAL | 3 refills | Status: AC

## 2018-11-13 NOTE — Telephone Encounter (Signed)
Copied from CRM [id number]. Topic: General - Other >> Nov 12, 2018 10:39 AM Gerrianne Scale wrote: Reason for CRM: pt calling stating that when she had her CPE Dr Patsy Lager was suppose to sent over Nexium or Prilosec to her pharmacy pt states that she really need it today  please call pt at [phone]

## 2018-11-13 NOTE — Addendum Note (Signed)
Addended by: Abbe Amsterdam C on: 11/13/2018 07:23 PM   Modules accepted: Orders

## 2018-11-13 NOTE — Telephone Encounter (Signed)
Called her back- I am sorry, I did not recall [place] discussing an rx for one of these meds.  She notes sx of gerd rx for prilosec sent to her pharmacy now

## 2019-04-03 ENCOUNTER — Emergency Department (HOSPITAL_BASED_OUTPATIENT_CLINIC_OR_DEPARTMENT_OTHER): Payer: PRIVATE HEALTH INSURANCE

## 2019-04-03 ENCOUNTER — Other Ambulatory Visit: Payer: Self-pay

## 2019-04-03 ENCOUNTER — Emergency Department (HOSPITAL_BASED_OUTPATIENT_CLINIC_OR_DEPARTMENT_OTHER)
Admission: EM | Admit: 2019-04-03 | Discharge: 2019-04-03 | Disposition: A | Discharge status: Home / Self Care | Payer: PRIVATE HEALTH INSURANCE | Attending: Emergency Medicine | Admitting: Emergency Medicine

## 2019-04-03 ENCOUNTER — Encounter (HOSPITAL_BASED_OUTPATIENT_CLINIC_OR_DEPARTMENT_OTHER): Payer: Self-pay

## 2019-04-03 DIAGNOSIS — Z79899 Other long term (current) drug therapy: Secondary | ICD-10-CM | POA: Insufficient documentation

## 2019-04-03 DIAGNOSIS — R509 Fever, unspecified: Secondary | ICD-10-CM | POA: Diagnosis not present

## 2019-04-03 DIAGNOSIS — B349 Viral infection, unspecified: Secondary | ICD-10-CM | POA: Insufficient documentation

## 2019-04-03 DIAGNOSIS — R51 Headache: Secondary | ICD-10-CM | POA: Diagnosis not present

## 2019-04-03 DIAGNOSIS — R079 Chest pain, unspecified: Secondary | ICD-10-CM

## 2019-04-03 DIAGNOSIS — Z91040 Latex allergy status: Secondary | ICD-10-CM | POA: Diagnosis not present

## 2019-04-03 DIAGNOSIS — R0602 Shortness of breath: Secondary | ICD-10-CM | POA: Insufficient documentation

## 2019-04-03 DIAGNOSIS — J45909 Unspecified asthma, uncomplicated: Secondary | ICD-10-CM | POA: Insufficient documentation

## 2019-04-03 DIAGNOSIS — Z20828 Contact with and (suspected) exposure to other viral communicable diseases: Secondary | ICD-10-CM | POA: Insufficient documentation

## 2019-04-03 DIAGNOSIS — R05 Cough: Secondary | ICD-10-CM | POA: Diagnosis present

## 2019-04-03 DIAGNOSIS — R519 Headache, unspecified: Secondary | ICD-10-CM

## 2019-04-03 LAB — LACTIC ACID, PLASMA: Lactic Acid, Venous: 1.6 mmol/L (ref 0.5–1.9)

## 2019-04-03 LAB — URINALYSIS, ROUTINE W REFLEX MICROSCOPIC
Bilirubin Urine: NEGATIVE
Glucose, UA: NEGATIVE mg/dL
Hgb urine dipstick: NEGATIVE
Ketones, ur: NEGATIVE mg/dL
Leukocytes,Ua: NEGATIVE
Nitrite: NEGATIVE
Protein, ur: NEGATIVE mg/dL
Specific Gravity, Urine: 1.02 (ref 1.005–1.030)
pH: 8.5 — ABNORMAL HIGH (ref 5.0–8.0)

## 2019-04-03 LAB — BASIC METABOLIC PANEL
Anion gap: 9 (ref 5–15)
BUN: 10 mg/dL (ref 6–20)
CO2: 25 mmol/L (ref 22–32)
Calcium: 8.8 mg/dL — ABNORMAL LOW (ref 8.9–10.3)
Chloride: 104 mmol/L (ref 98–111)
Creatinine, Ser: 0.74 mg/dL (ref 0.44–1.00)
GFR calc Af Amer: >60 mL/min (ref >60)
GFR calc non Af Amer: >60 mL/min (ref >60)
Glucose, Bld: 105 mg/dL — ABNORMAL HIGH (ref 70–99)
Potassium: 3.7 mmol/L (ref 3.5–5.1)
Sodium: 138 mmol/L (ref 135–145)

## 2019-04-03 LAB — TROPONIN I (HIGH SENSITIVITY)
Troponin I (High Sensitivity): <2 ng/L (ref <18)
Troponin I (High Sensitivity): <2 ng/L (ref <18)

## 2019-04-03 LAB — CBC WITH DIFFERENTIAL/PLATELET
Abs Immature Granulocytes: 0.03 10*3/uL (ref 0.00–0.07)
Basophils Absolute: 0 10*3/uL (ref 0.0–0.1)
Basophils Relative: 0 %
Eosinophils Absolute: 0.2 10*3/uL (ref 0.0–0.5)
Eosinophils Relative: 2 %
HCT: 41.3 % (ref 36.0–46.0)
Hemoglobin: 12.8 g/dL (ref 12.0–15.0)
Immature Granulocytes: 0 %
Lymphocytes Relative: 20 %
Lymphs Abs: 2 10*3/uL (ref 0.7–4.0)
MCH: 27.1 pg (ref 26.0–34.0)
MCHC: 31 g/dL (ref 30.0–36.0)
MCV: 87.5 fL (ref 80.0–100.0)
Monocytes Absolute: 0.8 10*3/uL (ref 0.1–1.0)
Monocytes Relative: 8 %
Neutro Abs: 6.9 10*3/uL (ref 1.7–7.7)
Neutrophils Relative %: 70 %
Platelets: 293 10*3/uL (ref 150–400)
RBC: 4.72 MIL/uL (ref 3.87–5.11)
RDW: 13.2 % (ref 11.5–15.5)
WBC: 10 10*3/uL (ref 4.0–10.5)
nRBC: 0 % (ref 0.0–0.2)

## 2019-04-03 LAB — D-DIMER, QUANTITATIVE: D-Dimer, Quant: <0.27 ug/mL-FEU (ref 0.00–0.50)

## 2019-04-03 LAB — SARS CORONAVIRUS 2 AG (30 MIN TAT): SARS Coronavirus 2 Ag: NEGATIVE

## 2019-04-03 LAB — PREGNANCY, URINE: Preg Test, Ur: NEGATIVE

## 2019-04-03 MED ORDER — SODIUM CHLORIDE 0.9 % IV BOLUS
1000.0000 mL | Freq: Once | INTRAVENOUS | Status: AC
  Administered 2019-04-03: 18:00:00 1000 mL via INTRAVENOUS

## 2019-04-03 MED ORDER — PROMETHAZINE HCL 25 MG/ML IJ SOLN
25.0000 mg | Freq: Once | INTRAMUSCULAR | Status: AC
  Administered 2019-04-03: 19:00:00 25 mg via INTRAVENOUS

## 2019-04-03 MED ORDER — PROCHLORPERAZINE EDISYLATE 10 MG/2ML IJ SOLN
10.0000 mg | Freq: Once | INTRAMUSCULAR | Status: AC
  Administered 2019-04-03: 20:00:00 10 mg via INTRAVENOUS
  Filled 2019-04-03: qty 2

## 2019-04-03 MED ORDER — DIPHENHYDRAMINE HCL 50 MG/ML IJ SOLN
25.0000 mg | Freq: Once | INTRAMUSCULAR | Status: AC
  Administered 2019-04-03: 20:00:00 25 mg via INTRAVENOUS
  Filled 2019-04-03: qty 1

## 2019-04-03 MED ORDER — PROCHLORPERAZINE EDISYLATE 10 MG/2ML IJ SOLN: 10.0000 mg | Freq: Once | INTRAMUSCULAR | Status: DC

## 2019-04-03 MED ORDER — KETOROLAC TROMETHAMINE 15 MG/ML IJ SOLN
15.0000 mg | Freq: Once | INTRAMUSCULAR | Status: DC
  Filled 2019-04-03: qty 1

## 2019-04-03 MED ORDER — PROMETHAZINE HCL 25 MG/ML IJ SOLN
INTRAMUSCULAR | Status: AC
  Filled 2019-04-03: qty 1

## 2019-04-03 MED ORDER — IOHEXOL 350 MG/ML SOLN
100.0000 mL | Freq: Once | INTRAVENOUS | Status: AC | PRN
  Administered 2019-04-03: 21:00:00 100 mL via INTRAVENOUS

## 2019-04-03 MED ORDER — ONDANSETRON HCL 4 MG/2ML IJ SOLN
4.0000 mg | Freq: Once | INTRAMUSCULAR | Status: AC
  Administered 2019-04-03: 4 mg via INTRAVENOUS
  Filled 2019-04-03: qty 2

## 2019-04-03 NOTE — ED Notes (Signed)
Pt ambulated to bedside commode unassisted. NAD noted

## 2019-04-03 NOTE — ED Notes (Signed)
ED Provider at bedside. 

## 2019-04-03 NOTE — Discharge Instructions (Signed)
You were seen in the ED today for chest pain, headache, shortness of breath, fevers. All of your labwork and imaging was reassuring today. We have sent out a covid 19 test that typically takes 2-3 days to come back; please self isolate until you hear about your results. Please follow up with your PCP.

## 2019-04-03 NOTE — ED Provider Notes (Signed)
Medical screening examination/treatment/procedure(s) were conducted as a shared visit with non-physician practitioner(s) and myself.  I personally evaluated the patient during the encounter. Briefly, the patient is a 43 y.o. female with history of migraines, asthma who presents the ED with body aches, headaches, chest pain.  Patient works at a hospital.  Recently had negative coronavirus testing.  She states that she is had fever, chest pain, back pain, headaches over the last several days.  Chest pain headache got worse today.  Hurts worse when she takes in a deep breath.  Exam is overall unremarkable.  No signs to suggest meningitis.  Normal neurological exam.  Physician assistant has already seen the patient is asked for my guidance.  She has overall normal EKG.  Troponin normal.  D-dimer normal.  Unlikely PE.  Unlikely ACS.  CT of the head shows no acute findings.  Urinalysis negative.  Our coronavirus test is negative.  Pregnancy test negative.  Will obtain a dissection study to further evaluate for any dissection or pneumonia.  Patient mostly still complaining of headache.  Will give additional IV headache medicine.  Suspect that she likely has viral process causing worsening migraine.  Dissection study unremarkable.  Repeat troponin normal.  Recommend follow-up with primary care doctor.  I talked to the patient about how our coronavirus test is fairly inaccurate.  And that she should likely get another COVID test with employee health before going back to work.  Discharged from ED in good condition.  This chart was dictated using voice recognition software.  Despite best efforts to proofread,  errors can occur which can change the documentation meaning.     EKG Interpretation  Date/Time:  Wednesday April 03 2019 16:39:35 EDT Ventricular Rate:  94 PR Interval:    QRS Duration: 82 QT Interval:  351 QTC Calculation: 439 R Axis:   27 Text Interpretation:  Sinus rhythm Low voltage, extremity and  precordial leads Baseline wander in lead(s) V5 Confirmed by Lennice Sites [phone]) on 04/03/2019 4:43:41 PM          Lennice Sites, DO 04/03/19 2129

## 2019-04-03 NOTE — ED Triage Notes (Signed)
pt c/o flu like sx x 1 week-neg covid test last week-HA and CP-NAD-steady gait

## 2019-04-03 NOTE — ED Notes (Addendum)
Patient experienced a reaction to compazine given. Patient states she "felt like bugs were crawling all over her." Patient experienced shakes and anxiety with an elevated heart rate. Provider informed of patient reaction. Provider spoke with patient, comfort provided.Patient allergies updated.

## 2019-04-03 NOTE — ED Provider Notes (Signed)
[facility] EMERGENCY DEPARTMENT Provider Note   CSN: [id number] Arrival date & time: 04/03/19  1620    History   Chief Complaint Chief Complaint  Patient presents with   Cough    HPI Michele Roberts is a 43 y.o. female with PMHx Asthma, GERD, and migraines who presents to the ED today complaining of URI like symptoms x 2 weeks. 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). Pt had a CXR done at that time and tested for covid 19; both were negative. Patient reports that today she began having a severe headache today as well as chest pain going into her back; she called Employee Health again who told her to follow up with PCP; pt went to her urgent care today and they told her she needs to come to the ED immediately. Pt states she has not had a fever in the last couple of days; she was told she cannot return to work until she is fever free for > 72 hours without antipyretics so she has been trying not to take them. She does endorse a hx of migraine headaches but states this headache is so severe and different from her previous headaches. Pt took 2 Excedrin Migraine today without relief. Denies vomiting, abdominal pain, urinary symptoms, back pain, neck stiffness, rash, or any other associated symptoms.        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      Home Medications    Prior to Admission medications   Medication Sig Start Date End Date Taking? Authorizing Provider  albuterol (PROVENTIL HFA;VENTOLIN HFA) 108 (90 Base) MCG/ACT inhaler Inhale 2 puffs into the lungs every 6 (six) hours as needed for wheezing or shortness of breath. 11/28/17   Sandford Craze'Sullivan, Melissa, NP  aspirin-acetaminophen-caffeine (EXCEDRIN MIGRAINE) [phone] MG tablet Take 1 tablet by mouth as needed for headache.    [provider]  cetirizine (ZYRTEC) 10 MG tablet Take 10 mg by mouth daily.    [provider]  fluticasone (FLONASE) 50 MCG/ACT nasal spray Place 1 spray into both nostrils daily. 10/17/18   Copland, Gwenlyn FoundJessica C, MD  levonorgestrel (MIRENA) 20 MCG/24HR IUD by Intrauterine route.    [provider]  montelukast (SINGULAIR) 10 MG tablet Take 1 tablet (10 mg total) by mouth at bedtime. 10/17/18   Copland, Gwenlyn FoundJessica C, MD  omeprazole (PRILOSEC) 40 MG capsule Take 1 capsule (40 mg total) by mouth daily. Use for 4-8 weeks as needed for GERD 11/13/18   Copland, Gwenlyn FoundJessica C, MD  rizatriptan (MAXALT) 5 MG  tablet Take 1 tablet (5 mg total) by mouth as needed for migraine. Max 30 mg per day 10/17/18   Copland, Gwenlyn FoundJessica C, MD  traZODone (DESYREL) 50 MG tablet Take 0.5-1 tablets (25-50 mg total) by mouth at bedtime as needed for sleep. 10/17/18   Copland, Gwenlyn FoundJessica C, MD    Family History Family History  Problem Relation Age of Onset   Stroke Maternal Grandmother    Hypertension Neg Hx    Diabetes Neg Hx    Cancer Neg Hx     Social History Social History   Tobacco Use   Smoking status: Never Smoker   Smokeless tobacco: Never Used  Substance Use Topics   Alcohol use: No   Drug use:  No     Allergies   Compazine [prochlorperazine edisylate], Amoxicillin, Banana, Latex, Other, Pineapple, and Shrimp [shellfish allergy]   Review of Systems Review of Systems  Constitutional: Positive for chills and fever.  HENT: Positive for rhinorrhea and sore throat. Negative for congestion and ear pain.   Eyes: Negative for visual disturbance.  Respiratory: Positive for cough and shortness of breath.   Cardiovascular: Positive for chest pain. Negative for palpitations and leg swelling.  Gastrointestinal: Positive for nausea. Negative for abdominal pain, constipation, diarrhea and vomiting.  Genitourinary: Negative for dysuria and frequency.  Musculoskeletal: Positive for myalgias.  Skin: Negative for rash.  Neurological: Positive for headaches. Negative for dizziness, syncope, speech difficulty, weakness, light-headedness and numbness.     Physical Exam Updated Vital Signs BP (!) 119/57    Pulse (!) 115    Temp 99.1 F (37.3 C) (Oral)    Resp 20    SpO2 100%   Physical Exam Vitals signs and nursing note reviewed.  Constitutional:      Appearance: She is ill-appearing and diaphoretic.  HENT:     Head: Normocephalic and atraumatic.  Eyes:     General: No scleral icterus.    Extraocular Movements: Extraocular movements intact.     Conjunctiva/sclera: Conjunctivae normal.     Pupils: Pupils are equal, round, and reactive to light.  Neck:     Musculoskeletal: Normal range of motion and neck supple. No neck rigidity.     Meningeal: Brudzinski's sign and Kernig's sign absent.  Cardiovascular:     Rate and Rhythm: Regular rhythm. Tachycardia present.  Pulmonary:     Breath sounds: No wheezing, rhonchi or rales.  Chest:     Chest wall: No tenderness.  Abdominal:     Palpations: Abdomen is soft.     Tenderness: There is no abdominal tenderness. There is no guarding or rebound.  Musculoskeletal: Normal range of motion.     Right lower leg: No edema.     Left lower leg: No  edema.  Skin:    General: Skin is warm.     Findings: No rash.  Neurological:     Mental Status: She is alert.     Comments: CN 3-12 grossly intact A&O x4 GCS 15 Sensation and strength intact Coordination with finger-to-nose WNL Neg pronator drift       ED Treatments / Results  Labs (all labs ordered are listed, but only abnormal results are displayed) Labs Reviewed  BASIC METABOLIC PANEL - Abnormal; Notable for the following components:      Result Value   Glucose, Bld 105 (*)    Calcium 8.8 (*)    All other components within normal limits  URINALYSIS, ROUTINE W REFLEX MICROSCOPIC - Abnormal; Notable for the following components:  APPearance CLOUDY (*)    pH 8.5 (*)    All other components within normal limits  SARS CORONAVIRUS 2 (HOSP ORDER, PERFORMED IN [facility] VIA [facility]OTT ID)  NOVEL CORONAVIRUS, NAA (HOSPITAL ORDER, SEND-OUT TO REF LAB)  CBC WITH DIFFERENTIAL/PLATELET  TROPONIN I (HIGH SENSITIVITY)  TROPONIN I (HIGH SENSITIVITY)  LACTIC ACID, PLASMA  PREGNANCY, URINE  D-DIMER, QUANTITATIVE (NOT AT [facility])  TROPONIN I (HIGH SENSITIVITY)  TROPONIN I (HIGH SENSITIVITY)    EKG EKG Interpretation  Date/Time:  Wednesday April 03 2019 16:39:35 EDT Ventricular Rate:  94 PR Interval:    QRS Duration: 82 QT Interval:  351 QTC Calculation: 439 R Axis:   27 Text Interpretation:  Sinus rhythm Low voltage, extremity and precordial leads Baseline wander in lead(s) V5 Confirmed by Virgina NorfolkAdam, [place] [phone]) on 04/03/2019 4:43:41 PM   Radiology Ct Head Wo Contrast  Result Date: 04/03/2019 CLINICAL DATA:  Headache EXAM: CT HEAD WITHOUT CONTRAST TECHNIQUE: Contiguous axial images were obtained from the base of the skull through the vertex without intravenous contrast. COMPARISON:  CT brain 04/24/2017 FINDINGS: Brain: No evidence of acute infarction, hemorrhage, hydrocephalus, extra-axial collection or mass lesion/mass effect. Vascular: No hyperdense vessel or unexpected  calcification. Skull: Normal. Negative for fracture or focal lesion. Sinuses/Orbits: No acute finding. Other: None IMPRESSION: Negative non contrasted CT appearance of the brain Electronically Signed   By: Jasmine PangKim  Fujinaga M.D.   On: 04/03/2019 18:56   Dg Chest Port 1 View  Result Date: 04/03/2019 CLINICAL DATA:  Chest pain EXAM: PORTABLE CHEST 1 VIEW COMPARISON:  03/28/2019, 01/26/2019 FINDINGS: The heart size and mediastinal contours are within normal limits. Both lungs are clear. The visualized skeletal structures are unremarkable. IMPRESSION: No active disease. Electronically Signed   By: Jasmine PangKim  Fujinaga M.D.   On: 04/03/2019 18:56   Ct Angio Chest/abd/pel For Dissection W And/or W/wo  Result Date: 04/03/2019 CLINICAL DATA:  Chest and back pain EXAM: CT ANGIOGRAPHY CHEST, ABDOMEN AND PELVIS TECHNIQUE: Multidetector CT imaging through the chest, abdomen and pelvis was performed using the standard protocol during bolus administration of intravenous contrast. Multiplanar reconstructed images and MIPs were obtained and reviewed to evaluate the vascular anatomy. CONTRAST:  100mL OMNIPAQUE IOHEXOL 350 MG/ML SOLN COMPARISON:  CT abdomen pelvis 05/23/2018, CT chest 04/24/2017 FINDINGS: CTA CHEST FINDINGS Cardiovascular: Non contrasted images of the chest demonstrate no intramural hematoma. Normal caliber thoracic aorta without evidence for dissection. The heart size is normal. No pericardial effusion. Mediastinum/Nodes: No enlarged mediastinal, hilar, or axillary lymph nodes. Thyroid gland, trachea, and esophagus demonstrate no significant findings. Lungs/Pleura: Lungs are clear. No pleural effusion or pneumothorax. Musculoskeletal: No chest wall abnormality. No acute or significant osseous findings. Review of the MIP images confirms the above findings. CTA ABDOMEN AND PELVIS FINDINGS VASCULAR Aorta: Normal caliber aorta without aneurysm, dissection, vasculitis or significant stenosis. Celiac: Patent without evidence  of aneurysm, dissection, vasculitis or significant stenosis. SMA: Patent without evidence of aneurysm, dissection, vasculitis or significant stenosis. Renals: Single right and single left renal arteries are widely patent. IMA: Not well seen. Diminutive in caliber with possible narrowing at origin but with flow enhancement visible to at least the iliac bifurcation. Inflow: Patent without evidence of aneurysm, dissection, vasculitis or significant stenosis. Veins: No obvious venous abnormality within the limitations of this arterial phase study. Review of the MIP images confirms the above findings. NON-VASCULAR Hepatobiliary: No focal liver abnormality is seen. No gallstones, gallbladder wall thickening, or biliary dilatation. Pancreas: Unremarkable. No pancreatic ductal dilatation or surrounding  inflammatory changes. Spleen: Normal in size without focal abnormality. Adrenals/Urinary Tract: Adrenal glands are unremarkable. Kidneys are normal, without renal calculi, focal lesion, or hydronephrosis. Bladder is unremarkable. Stomach/Bowel: Stomach is within normal limits. Status post appendectomy. No evidence of bowel wall thickening, distention, or inflammatory changes. Lymphatic: No significantly enlarged lymph nodes. Reproductive: Intrauterine device within the uterus. No adnexal mass. Other: Negative for free air or free fluid. Musculoskeletal: No acute or significant osseous findings. Review of the MIP images confirms the above findings. IMPRESSION: 1. Negative for acute aortic dissection or aneurysm. Clear lung fields. 2. No CT evidence for acute intra-abdominal or pelvic abnormality. Electronically Signed   By: Jasmine Pang M.D.   On: 04/03/2019 21:16    Procedures Procedures (including critical care time)  Medications Ordered in ED Medications  sodium chloride 0.9 % bolus 1,000 mL (0 mLs Intravenous Stopped 04/03/19 1916)  ondansetron (ZOFRAN) injection 4 mg (4 mg Intravenous Given 04/03/19 1828)    promethazine (PHENERGAN) injection 25 mg (25 mg Intravenous Given 04/03/19 1924)  prochlorperazine (COMPAZINE) injection 10 mg (10 mg Intravenous Given 04/03/19 2013)  diphenhydrAMINE (BENADRYL) injection 25 mg (25 mg Intravenous Given 04/03/19 2013)  iohexol (OMNIPAQUE) 350 MG/ML injection 100 mL (100 mLs Intravenous Contrast Given 04/03/19 2058)     Initial Impression / Assessment and Plan / ED Course  I have reviewed the triage vital signs and the nursing notes.  Pertinent labs & imaging results that were available during my care of the patient were reviewed by me and considered in my medical decision making (see chart for details).    43 year old female presenting with multiple complaints including severe headache, chest pain, shortness of breath, fevers, nausea. Seen at [facility] and treated with doxycycline 2 weeks ago without improvement. Seen at [facility] through [place] last week, tested negative for covid 19 and had negative CXR. 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 Pt presenting today for severe sudden onset headache as well as chest pain. No hx cardiac disease. No Fhx CAD. Pt also tachycardic on exam as high as 115; no tachypnea; pt afebrile in the ED at 99.1 although has taken Excedrin toady. Does not meet SIRS criteria currently. Pt speaking in full sentences at this time and satting 100% on RA although does appear uncomfortable and is diaphoretic on exam. Given symptoms concern for infectious etiology including covid 19 despite negative test last week vs PNA vs meningitis with headache; pt does not have neck stiffness or rash; negative Brudzinki's and Kernig's exam. CT Head ordered to rule out intracranial process. Also concern for ACS vs PE given new onset chest pain this AM; cannot PERC patient out due to tachycardia; will obtain CBC, BMP, high sensitivity troponin, EKG, CXR, D-Dimer.  1 L IVF given as well as zofran for nausea. Will  reevaluate once labs and imaging return.  Nursing staff informed that patient still feeling nauseated despite zofran; will give phenergan.   CT Head and CXR negative at this time. Labwork reassuring as well; normal lactic acid; no WBC to suggest infectious etiology; no electrolyte abnormalities; urine clear. D-dimer negative and initial high sensitivity trop negative. Will obtain repeat high sensitivity trop prior to discharge. Pt still complaining of a headache; will give headache cocktail today including compazine and benadryl; pt already receiving fluids. Dr. Lockie Mola attending physician evaluated patient as well; suggest dissection study given complaint of chest pain with radiation to the back; will  order today although low suspicion given equal pulses throughout. [employer] covid test negative; will likely send patient home with send out test even with negative test prior to coming to the ED. Will reevaluate once dissection study returns.   Delta trop negative today. Dissection study negative as well. Pt did have mild dystonic reaction with phenergan as well as headache cocktail despite benadryl. Observed patient for sometime; seems to be getting better. Will discharge at this time with PCP follow up; advised to follow up with employee health as well. Patient to stay home until she receives send out test results.   Michele Roberts was evaluated in Emergency Department on 04/03/2019 for the symptoms described in the history of present illness. She was evaluated in the context of the global COVID-19 pandemic, which necessitated consideration that the patient might be at risk for infection with the SARS-CoV-2 virus that causes COVID-19. Institutional protocols and algorithms that pertain to the evaluation of patients at risk for COVID-19 are in a state of rapid change based on information released by regulatory bodies including the CDC and federal and state organizations. These policies and algorithms were  followed during the patient's care in the ED.       Final Clinical Impressions(s) / ED Diagnoses   Final diagnoses:  Viral illness  Chest pain, unspecified type  Acute nonintractable headache, unspecified headache type    ED Discharge Orders    None       Eustaquio Maize, PA-C 04/03/19 [place], [place], DO 04/03/19 2249

## 2019-04-05 LAB — NOVEL CORONAVIRUS, NAA (HOSP ORDER, SEND-OUT TO REF LAB; TAT 18-24 HRS): SARS-CoV-2, NAA: NOT DETECTED

## 2019-04-24 ENCOUNTER — Emergency Department (HOSPITAL_BASED_OUTPATIENT_CLINIC_OR_DEPARTMENT_OTHER)
Admission: EM | Admit: 2019-04-24 | Discharge: 2019-04-24 | Disposition: A | Discharge status: Home / Self Care | Payer: PRIVATE HEALTH INSURANCE | Attending: Emergency Medicine | Admitting: Emergency Medicine

## 2019-04-24 ENCOUNTER — Other Ambulatory Visit: Payer: Self-pay

## 2019-04-24 ENCOUNTER — Encounter (HOSPITAL_BASED_OUTPATIENT_CLINIC_OR_DEPARTMENT_OTHER): Payer: Self-pay

## 2019-04-24 ENCOUNTER — Emergency Department (HOSPITAL_BASED_OUTPATIENT_CLINIC_OR_DEPARTMENT_OTHER): Payer: PRIVATE HEALTH INSURANCE

## 2019-04-24 DIAGNOSIS — M2550 Pain in unspecified joint: Secondary | ICD-10-CM

## 2019-04-24 DIAGNOSIS — Z79899 Other long term (current) drug therapy: Secondary | ICD-10-CM | POA: Insufficient documentation

## 2019-04-24 DIAGNOSIS — R1031 Right lower quadrant pain: Secondary | ICD-10-CM

## 2019-04-24 DIAGNOSIS — Z3202 Encounter for pregnancy test, result negative: Secondary | ICD-10-CM | POA: Insufficient documentation

## 2019-04-24 DIAGNOSIS — J45909 Unspecified asthma, uncomplicated: Secondary | ICD-10-CM | POA: Diagnosis not present

## 2019-04-24 DIAGNOSIS — Z91040 Latex allergy status: Secondary | ICD-10-CM | POA: Diagnosis not present

## 2019-04-24 DIAGNOSIS — J039 Acute tonsillitis, unspecified: Secondary | ICD-10-CM

## 2019-04-24 LAB — COMPREHENSIVE METABOLIC PANEL
ALT: 39 U/L (ref 0–44)
AST: 31 U/L (ref 15–41)
Albumin: 3.7 g/dL (ref 3.5–5.0)
Alkaline Phosphatase: 63 U/L (ref 38–126)
Anion gap: 9 (ref 5–15)
BUN: 8 mg/dL (ref 6–20)
CO2: 23 mmol/L (ref 22–32)
Calcium: 8.5 mg/dL — ABNORMAL LOW (ref 8.9–10.3)
Chloride: 103 mmol/L (ref 98–111)
Creatinine, Ser: 0.65 mg/dL (ref 0.44–1.00)
GFR calc Af Amer: >60 mL/min (ref >60)
GFR calc non Af Amer: >60 mL/min (ref >60)
Glucose, Bld: 97 mg/dL (ref 70–99)
Potassium: 3.6 mmol/L (ref 3.5–5.1)
Sodium: 135 mmol/L (ref 135–145)
Total Bilirubin: 0.9 mg/dL (ref 0.3–1.2)
Total Protein: 7.1 g/dL (ref 6.5–8.1)

## 2019-04-24 LAB — CBC WITH DIFFERENTIAL/PLATELET
Abs Immature Granulocytes: 0.04 10*3/uL (ref 0.00–0.07)
Basophils Absolute: 0 10*3/uL (ref 0.0–0.1)
Basophils Relative: 0 %
Eosinophils Absolute: 0.1 10*3/uL (ref 0.0–0.5)
Eosinophils Relative: 0 %
HCT: 39.3 % (ref 36.0–46.0)
Hemoglobin: 12.1 g/dL (ref 12.0–15.0)
Immature Granulocytes: 0 %
Lymphocytes Relative: 12 %
Lymphs Abs: 1.7 10*3/uL (ref 0.7–4.0)
MCH: 27.1 pg (ref 26.0–34.0)
MCHC: 30.8 g/dL (ref 30.0–36.0)
MCV: 87.9 fL (ref 80.0–100.0)
Monocytes Absolute: 1.2 10*3/uL — ABNORMAL HIGH (ref 0.1–1.0)
Monocytes Relative: 9 %
Neutro Abs: 10.8 10*3/uL — ABNORMAL HIGH (ref 1.7–7.7)
Neutrophils Relative %: 79 %
Platelets: 312 10*3/uL (ref 150–400)
RBC: 4.47 MIL/uL (ref 3.87–5.11)
RDW: 13.2 % (ref 11.5–15.5)
WBC: 13.8 10*3/uL — ABNORMAL HIGH (ref 4.0–10.5)
nRBC: 0 % (ref 0.0–0.2)

## 2019-04-24 LAB — URINALYSIS, ROUTINE W REFLEX MICROSCOPIC
Bilirubin Urine: NEGATIVE
Glucose, UA: NEGATIVE mg/dL
Hgb urine dipstick: NEGATIVE
Ketones, ur: 40 mg/dL — AB
Leukocytes,Ua: NEGATIVE
Nitrite: NEGATIVE
Protein, ur: NEGATIVE mg/dL
Specific Gravity, Urine: 1.015 (ref 1.005–1.030)
pH: 7 (ref 5.0–8.0)

## 2019-04-24 LAB — C-REACTIVE PROTEIN: CRP: 5.8 mg/dL — ABNORMAL HIGH (ref <1.0)

## 2019-04-24 LAB — MONONUCLEOSIS SCREEN: Mono Screen: NEGATIVE

## 2019-04-24 LAB — PREGNANCY, URINE: Preg Test, Ur: NEGATIVE

## 2019-04-24 LAB — SEDIMENTATION RATE: Sed Rate: 21 mm/hr (ref 0–22)

## 2019-04-24 LAB — GROUP A STREP BY PCR: Group A Strep by PCR: NOT DETECTED

## 2019-04-24 LAB — LIPASE, BLOOD: Lipase: 23 U/L (ref 11–51)

## 2019-04-24 MED ORDER — IOHEXOL 300 MG/ML  SOLN
100.0000 mL | Freq: Once | INTRAMUSCULAR | Status: AC | PRN
  Administered 2019-04-24: 22:00:00 100 mL via INTRAVENOUS

## 2019-04-24 MED ORDER — DEXAMETHASONE SODIUM PHOSPHATE 10 MG/ML IJ SOLN
10.0000 mg | Freq: Once | INTRAMUSCULAR | Status: AC
  Administered 2019-04-24: 10 mg via INTRAVENOUS
  Filled 2019-04-24: qty 1

## 2019-04-24 MED ORDER — SODIUM CHLORIDE 0.9 % IV BOLUS
1000.0000 mL | Freq: Once | INTRAVENOUS | Status: AC
  Administered 2019-04-24: 1000 mL via INTRAVENOUS

## 2019-04-24 NOTE — ED Provider Notes (Signed)
[facility] EMERGENCY DEPARTMENT Provider Note   CSN: [id number] Arrival date & time: 04/24/19  1738    History   Chief Complaint Chief Complaint  Patient presents with   Generalized Body Aches    HPI Michele Roberts is a 43 y.o. female.     43 year old female presents with multiple complaints including right lower quadrant abdominal pain, chills, sore throat, body aches.  Patient states that she had a COVID symptoms for the last 2 weeks of June, described as cough and congestion.  Patient had just returned to work on June 30 when she began developing body aches.  Patient reports progressively worsening body aches specifically over her right lateral hip.  Patient was seen at urgent care 3 days ago, diagnosed with bursitis, had a normal x-ray of her right hip.  Patient states that she took her daughter to the dentist today and had difficulty signing the paperwork due to pain in her hands.  Patient also reports right lower quadrant abdominal pain, radiates around her lower abdomen and back, denies nausea, vomiting, changes in bowel or bladder habits.  Reports prior C-section and appendectomy.  Patient developed sore throat today, worse with swallowing, has chosen not to eat or drink much today due to pain in her throat and difficulty swallowing.  Patient went to her PCP office today where she was found to be febrile with right lower quadrant abdominal pain and enlarged tonsils and sent to the emergency room for evaluation.  No known sick contacts however patient does work in Dentist in an emergency room.  Patient has had 3 COVID tests that were all negative.  Michele Roberts was evaluated in Emergency Department on 04/24/2019 for the symptoms described in the history of present illness. She was evaluated in the context of the global COVID-19 pandemic, which necessitated consideration that the patient might be at risk for infection with the SARS-CoV-2 virus that causes COVID-19.  Institutional protocols and algorithms that pertain to the evaluation of patients at risk for COVID-19 are in a state of rapid change based on information released by regulatory bodies including the CDC and federal and state organizations. These policies and algorithms were followed during the patient's care in the ED.      Past Medical History:  Diagnosis Date   Asthma    GERD (gastroesophageal reflux disease)    Migraines    Migraines     Patient Active Problem List   Diagnosis Date Noted   IBS (irritable bowel syndrome) 08/29/2011   ADJUSTMENT DISORDER WITH DEPRESSED MOOD 97/35/3299   HELICOBACTER PYLORI INFECTION 07/21/2010   MIGRAINE, MENSTRUAL 07/21/2010   GERD 07/21/2010   THYROID FUNCTION TEST, ABNORMAL 07/21/2010   ALLERGIC REACTION 07/21/2010    Past Surgical History:  Procedure Laterality Date   APPENDECTOMY     CESAREAN SECTION       OB History   No obstetric history on file.      Home Medications    Prior to Admission medications   Medication Sig Start Date End Date Taking? Authorizing Provider  albuterol (PROVENTIL HFA;VENTOLIN HFA) 108 (90 Base) MCG/ACT inhaler Inhale 2 puffs into the lungs every 6 (six) hours as needed for wheezing or shortness of breath. 11/28/17   Debbrah Alar, NP  aspirin-acetaminophen-caffeine (EXCEDRIN MIGRAINE) [phone] MG tablet Take 1 tablet by mouth as needed for headache.    [provider]  cetirizine (ZYRTEC) 10 MG tablet Take 10 mg by mouth daily.    [provider]  fluticasone (  FLONASE) 50 MCG/ACT nasal spray Place 1 spray into both nostrils daily. 10/17/18   Copland, Gwenlyn FoundJessica C, MD  levonorgestrel (MIRENA) 20 MCG/24HR IUD by Intrauterine route.    [provider]  montelukast (SINGULAIR) 10 MG tablet Take 1 tablet (10 mg total) by mouth at bedtime. 10/17/18   Copland, Gwenlyn FoundJessica C, MD  omeprazole (PRILOSEC) 40 MG capsule Take 1 capsule (40 mg total) by mouth daily. Use for 4-8 weeks  as needed for GERD 11/13/18   Copland, Gwenlyn FoundJessica C, MD  rizatriptan (MAXALT) 5 MG tablet Take 1 tablet (5 mg total) by mouth as needed for migraine. Max 30 mg per day 10/17/18   Copland, Gwenlyn FoundJessica C, MD  traZODone (DESYREL) 50 MG tablet Take 0.5-1 tablets (25-50 mg total) by mouth at bedtime as needed for sleep. 10/17/18   Copland, Gwenlyn FoundJessica C, MD    Family History Family History  Problem Relation Age of Onset   Stroke Maternal Grandmother    Hypertension Neg Hx    Diabetes Neg Hx    Cancer Neg Hx     Social History Social History   Tobacco Use   Smoking status: Never Smoker   Smokeless tobacco: Never Used  Substance Use Topics   Alcohol use: No   Drug use: No     Allergies   Compazine [prochlorperazine edisylate], Amoxicillin, Banana, Latex, Other, Pineapple, and Shrimp [shellfish allergy]   Review of Systems Review of Systems  Constitutional: Positive for chills.  HENT: Positive for sore throat and trouble swallowing. Negative for congestion, rhinorrhea, sinus pressure and sinus pain.   Respiratory: Negative for cough and shortness of breath.   Cardiovascular: Negative for chest pain.  Gastrointestinal: Positive for abdominal pain. Negative for constipation, diarrhea, nausea and vomiting.  Genitourinary: Negative for dysuria, frequency and urgency.  Musculoskeletal: Positive for arthralgias, back pain and myalgias. Negative for neck pain and neck stiffness.  Skin: Negative for color change, rash and wound.  Allergic/Immunologic: Negative for immunocompromised state.  Neurological: Negative for weakness.  Hematological: Negative for adenopathy.  Psychiatric/Behavioral: Negative for confusion.  All other systems reviewed and are negative.    Physical Exam Updated Vital Signs BP 125/86 (BP Location: Left Arm)    Pulse (!) 118    Temp 99.1 F (37.3 C) (Oral)    Resp 18    Ht 4\' 11"  (1.499 m)    Wt 76.3 kg    SpO2 100%    BMI 34.00 kg/m   Physical Exam Vitals signs and  nursing note reviewed.  Constitutional:      General: She is not in acute distress.    Appearance: She is well-developed. She is not diaphoretic.  HENT:     Head: Normocephalic and atraumatic.     Jaw: No trismus.     Mouth/Throat:     Mouth: Mucous membranes are moist.     Pharynx: Oropharyngeal exudate present.     Tonsils: Tonsillar exudate present. No tonsillar abscesses. 3+ on the right. 3+ on the left.  Eyes:     Conjunctiva/sclera: Conjunctivae normal.  Cardiovascular:     Rate and Rhythm: Regular rhythm. Tachycardia present.     Pulses: Normal pulses.     Heart sounds: Normal heart sounds.  Pulmonary:     Effort: Pulmonary effort is normal.     Breath sounds: Normal breath sounds.  Abdominal:     Palpations: Abdomen is soft.     Tenderness: There is abdominal tenderness. There is no right CVA tenderness or left CVA  tenderness.  Musculoskeletal:        General: Tenderness present. No swelling, deformity or signs of injury.     Right lower leg: No edema.     Left lower leg: No edema.       Legs:     Comments: TTP right lateral hip, no erythema or rash  Skin:    General: Skin is warm and dry.     Findings: No erythema or rash.  Neurological:     Mental Status: She is alert and oriented to person, place, and time.  Psychiatric:        Behavior: Behavior normal.      ED Treatments / Results  Labs (all labs ordered are listed, but only abnormal results are displayed) Labs Reviewed  GROUP A STREP BY PCR  MONONUCLEOSIS SCREEN  CBC WITH DIFFERENTIAL/PLATELET  COMPREHENSIVE METABOLIC PANEL  LIPASE, BLOOD  URINALYSIS, ROUTINE W REFLEX MICROSCOPIC  SEDIMENTATION RATE  C-REACTIVE PROTEIN    EKG None  Radiology No results found.  Procedures Procedures (including critical care time)  Medications Ordered in ED Medications  sodium chloride 0.9 % bolus 1,000 mL (has no administration in time range)  dexamethasone (DECADRON) injection 10 mg (has no  administration in time range)     Initial Impression / Assessment and Plan / ED Course  I have reviewed the triage vital signs and the nursing notes.  Pertinent labs & imaging results that were available during my care of the patient were reviewed by me and considered in my medical decision making (see chart for details).  Clinical Course as of Apr 23 2205  Wed Apr 24, 2019  [id number] 43 year old female presents with complaint of body aches, sore throat, abdominal pain.  On exam patient has enlarged tonsils with white exudate, she is rapid strep and mono negative.  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  CT abdomen pelvis is normal.  Review of lab work shows a normal sed rate, normal lipase, CMP without significant changes, CBC with mild leukocytosis at 13.8.  Urinalysis positive for ketones.  Negative pregnancy test.  Case discussed with Dr. Lynelle DoctorKnapp, ER attending, agrees with plan of care, patient to follow-up with PCP for further work-up, consider rheumatology referral.  Return to ER for worsening or concerning symptoms.  Patient is agreeable with plan of care.  Patient will complete her IV fluids prior to discharge, she was also given Decadron while in the ER.   [LM]    Clinical Course User Index [LM] Jeannie FendMurphy, Harmonie Verrastro A, PA-C      Final Clinical Impressions(s) / ED Diagnoses   Final diagnoses:  None    ED Discharge Orders    None       Alden HippMurphy, Courtenay Hirth A, PA-C 04/24/19 2207    Linwood DibblesKnapp, Jon, MD 04/25/19 1204

## 2019-04-24 NOTE — ED Triage Notes (Addendum)
Pt c/o "all over body pain" x 1-2 weeks-reports covid test x 3 all negative-pt also c/o RLQ abd pain and right hip pain x 2 days-denies injury to hip-pt states she was sent by PCP today-pt to triage in w/c-NAD

## 2019-04-24 NOTE — Discharge Instructions (Signed)
Take Motrin and Tylenol as needed as directed for fevers, body aches, throat pain.  You may take the children's liquid medication if that is easier to swallow.  You may gargle with warm salt water and drink warm tea with honey to help with tonsil pain.  It is important that you stay hydrated to help with your tonsil pain and overall health. Follow-up with your primary care doctor to review your results from today's visit, consider follow-up with rheumatology.

## 2019-09-15 ENCOUNTER — Other Ambulatory Visit: Payer: Self-pay

## 2019-09-15 ENCOUNTER — Emergency Department (HOSPITAL_BASED_OUTPATIENT_CLINIC_OR_DEPARTMENT_OTHER)
Admission: EM | Admit: 2019-09-15 | Discharge: 2019-09-15 | Disposition: A | Discharge status: Home / Self Care | Payer: PRIVATE HEALTH INSURANCE | Attending: Emergency Medicine | Admitting: Emergency Medicine

## 2019-09-15 ENCOUNTER — Encounter (HOSPITAL_BASED_OUTPATIENT_CLINIC_OR_DEPARTMENT_OTHER): Payer: Self-pay | Admitting: Emergency Medicine

## 2019-09-15 ENCOUNTER — Emergency Department (HOSPITAL_BASED_OUTPATIENT_CLINIC_OR_DEPARTMENT_OTHER): Payer: PRIVATE HEALTH INSURANCE

## 2019-09-15 DIAGNOSIS — R0602 Shortness of breath: Secondary | ICD-10-CM | POA: Diagnosis not present

## 2019-09-15 DIAGNOSIS — Z79899 Other long term (current) drug therapy: Secondary | ICD-10-CM | POA: Diagnosis not present

## 2019-09-15 DIAGNOSIS — R0989 Other specified symptoms and signs involving the circulatory and respiratory systems: Secondary | ICD-10-CM | POA: Diagnosis not present

## 2019-09-15 DIAGNOSIS — K219 Gastro-esophageal reflux disease without esophagitis: Secondary | ICD-10-CM | POA: Insufficient documentation

## 2019-09-15 DIAGNOSIS — J45909 Unspecified asthma, uncomplicated: Secondary | ICD-10-CM | POA: Diagnosis not present

## 2019-09-15 DIAGNOSIS — Z91040 Latex allergy status: Secondary | ICD-10-CM | POA: Insufficient documentation

## 2019-09-15 DIAGNOSIS — R05 Cough: Secondary | ICD-10-CM | POA: Diagnosis present

## 2019-09-15 LAB — COMPREHENSIVE METABOLIC PANEL
ALT: 32 U/L (ref 0–44)
AST: 22 U/L (ref 15–41)
Albumin: 3.7 g/dL (ref 3.5–5.0)
Alkaline Phosphatase: 57 U/L (ref 38–126)
Anion gap: 7 (ref 5–15)
BUN: 9 mg/dL (ref 6–20)
CO2: 25 mmol/L (ref 22–32)
Calcium: 8.8 mg/dL — ABNORMAL LOW (ref 8.9–10.3)
Chloride: 108 mmol/L (ref 98–111)
Creatinine, Ser: 0.76 mg/dL (ref 0.44–1.00)
GFR calc Af Amer: >60 mL/min (ref >60)
GFR calc non Af Amer: >60 mL/min (ref >60)
Glucose, Bld: 89 mg/dL (ref 70–99)
Potassium: 3.6 mmol/L (ref 3.5–5.1)
Sodium: 140 mmol/L (ref 135–145)
Total Bilirubin: 0.5 mg/dL (ref 0.3–1.2)
Total Protein: 6.8 g/dL (ref 6.5–8.1)

## 2019-09-15 LAB — CBC WITH DIFFERENTIAL/PLATELET
Abs Immature Granulocytes: 0.02 10*3/uL (ref 0.00–0.07)
Basophils Absolute: 0.1 10*3/uL (ref 0.0–0.1)
Basophils Relative: 1 %
Eosinophils Absolute: 0.5 10*3/uL (ref 0.0–0.5)
Eosinophils Relative: 7 %
HCT: 38.8 % (ref 36.0–46.0)
Hemoglobin: 12.1 g/dL (ref 12.0–15.0)
Immature Granulocytes: 0 %
Lymphocytes Relative: 31 %
Lymphs Abs: 2.4 10*3/uL (ref 0.7–4.0)
MCH: 26.8 pg (ref 26.0–34.0)
MCHC: 31.2 g/dL (ref 30.0–36.0)
MCV: 85.8 fL (ref 80.0–100.0)
Monocytes Absolute: 0.8 10*3/uL (ref 0.1–1.0)
Monocytes Relative: 11 %
Neutro Abs: 4 10*3/uL (ref 1.7–7.7)
Neutrophils Relative %: 50 %
Platelets: 328 10*3/uL (ref 150–400)
RBC: 4.52 MIL/uL (ref 3.87–5.11)
RDW: 12.8 % (ref 11.5–15.5)
WBC: 7.8 10*3/uL (ref 4.0–10.5)
nRBC: 0 % (ref 0.0–0.2)

## 2019-09-15 LAB — LIPASE, BLOOD: Lipase: 32 U/L (ref 11–51)

## 2019-09-15 MED ORDER — ALUM & MAG HYDROXIDE-SIMETH 200-200-20 MG/5ML PO SUSP
30.0000 mL | Freq: Once | ORAL | Status: AC
  Administered 2019-09-15: 30 mL via ORAL
  Filled 2019-09-15: qty 30

## 2019-09-15 MED ORDER — PANTOPRAZOLE SODIUM 40 MG IV SOLR
40.0000 mg | Freq: Once | INTRAVENOUS | Status: AC
  Administered 2019-09-15: 40 mg via INTRAVENOUS
  Filled 2019-09-15: qty 40

## 2019-09-15 MED ORDER — LIDOCAINE VISCOUS HCL 2 % MT SOLN
15.0000 mL | Freq: Once | OROMUCOSAL | Status: AC
  Administered 2019-09-15: 02:00:00 15 mL via ORAL
  Filled 2019-09-15: qty 15

## 2019-09-15 NOTE — ED Triage Notes (Signed)
Patient states that she has been placed on a new Reflux medication recently but it has not helped. The patient reports that she woke up tonight feeling SOB and a feeling like she can not get her breath. She tried a Breathing treatment prior to arrival, as well as other OTC remedies. The patient continues to have SOB and a sore throat and feeling like there is " something stuck in my throat"

## 2019-09-15 NOTE — ED Notes (Signed)
GERD d/c instructions provided at length. Pt voiced understanding

## 2019-09-15 NOTE — Discharge Instructions (Addendum)
Your seen today for your reflux.  Avoid foods that provoke your symptoms.  Try to sleep as upright as possible.  Follow-up closely with your gastroenterologist.  Continue your medications as prescribed.

## 2019-09-15 NOTE — ED Notes (Signed)
MD with pt  

## 2019-09-15 NOTE — ED Provider Notes (Signed)
[facility] EMERGENCY DEPARTMENT Provider Note   CSN: [id number] Arrival date & time: 09/15/19  0038     History   Chief Complaint Chief Complaint  Patient presents with  . Shortness of Breath    HPI Michele Roberts is a 43 y.o. female.     HPI  This 43 year old female with a history of IBS, migraines, reflux who presents with worsening cough, choking sensation.  Patient reports she has had progressively worsening reflux symptoms over the last month.  She has seen her GI doctor and has recently changed medications with minimal relief.  She states that she is avoiding provoking foods but continues to have a dry cough.  She denies fevers.  She states tonight she woke up "choking and feeling like my saliva was stuck in my throat."  She reports symptoms are worse when she lays flat at night.  She tries to sit up with some relief.  She reports persistent dry cough.  This has been ongoing for some time.  She reports dysphagia as well.  She recently was switched from pantoprazole twice daily to rabeprazole and Pepcid.  She has not noted any significant relief of symptoms.  She is due to have further testing at [facility].  Chart reviewed.  She sees gastroenterology in [facility] through evaluate for prescription.  She had a colonoscopy and endoscopy in 2019 which were largely reassuring.  She has been on multiple antacids including omeprazole and pantoprazole with minimal relief.  Due for pH testing as next step.  Past Medical History:  Diagnosis Date  . Asthma   . GERD (gastroesophageal reflux disease)   . Migraines   . Migraines     Patient Active Problem List   Diagnosis Date Noted  . IBS (irritable bowel syndrome) 08/29/2011  . ADJUSTMENT DISORDER WITH DEPRESSED MOOD 08/26/2010  . HELICOBACTER PYLORI INFECTION 07/21/2010  . MIGRAINE, MENSTRUAL 07/21/2010  . GERD 07/21/2010  . THYROID FUNCTION TEST, ABNORMAL 07/21/2010  . ALLERGIC REACTION 07/21/2010    Past  Surgical History:  Procedure Laterality Date  . APPENDECTOMY    . CESAREAN SECTION       OB History   No obstetric history on file.      Home Medications    Prior to Admission medications   Medication Sig Start Date End Date Taking? Authorizing Provider  famotidine (PEPCID) 40 MG tablet Take by mouth. 09/04/19 10/04/19 Yes [provider]  RABEprazole (ACIPHEX) 20 MG tablet Take 20 mg by mouth daily.   Yes [provider]  albuterol (PROVENTIL HFA;VENTOLIN HFA) 108 (90 Base) MCG/ACT inhaler Inhale 2 puffs into the lungs every 6 (six) hours as needed for wheezing or shortness of breath. 11/28/17   Sandford Craze, NP  aspirin-acetaminophen-caffeine (EXCEDRIN MIGRAINE) [phone] MG tablet Take 1 tablet by mouth as needed for headache.    [provider]  cetirizine (ZYRTEC) 10 MG tablet Take 10 mg by mouth daily.    [provider]  fluticasone (FLONASE) 50 MCG/ACT nasal spray Place 1 spray into both nostrils daily. 10/17/18   Copland, Gwenlyn Found, MD  levonorgestrel (MIRENA) 20 MCG/24HR IUD by Intrauterine route.    [provider]  montelukast (SINGULAIR) 10 MG tablet Take 1 tablet (10 mg total) by mouth at bedtime. 10/17/18   Copland, Gwenlyn Found, MD  omeprazole (PRILOSEC) 40 MG capsule Take 1 capsule (40 mg total) by mouth daily. Use for 4-8 weeks as needed for GERD 11/13/18   Copland, Gwenlyn Found, MD  rizatriptan (MAXALT) 5 MG tablet Take 1 tablet (5 mg total) by mouth as needed for migraine. Max 30 mg per day 10/17/18   Copland, Gwenlyn FoundJessica C, MD  traZODone (DESYREL) 50 MG tablet Take 0.5-1 tablets (25-50 mg total) by mouth at bedtime as needed for sleep. 10/17/18   Copland, Gwenlyn FoundJessica C, MD    Family History Family History  Problem Relation Age of Onset  . Stroke Maternal Grandmother   . Hypertension Neg Hx   . Diabetes Neg Hx   . Cancer Neg Hx     Social History Social History   Tobacco Use  . Smoking status: Never Smoker  . Smokeless  tobacco: Never Used  Substance Use Topics  . Alcohol use: No  . Drug use: No     Allergies   Compazine [prochlorperazine edisylate], Amoxicillin, Banana, Latex, Other, Pineapple, and Shrimp [shellfish allergy]   Review of Systems Review of Systems  Constitutional: Negative for fever.  HENT: Positive for sore throat.        Dysphagia  Respiratory: Positive for cough. Negative for chest tightness and shortness of breath.   Cardiovascular: Negative for chest pain.  Gastrointestinal: Negative for abdominal pain, nausea and vomiting.  All other systems reviewed and are negative.    Physical Exam Updated Vital Signs BP 122/63 (BP Location: Right Arm)   Pulse 93   Temp 99.1 F (37.3 C) (Oral)   Resp 20   Ht 1.499 m (4\' 11" )   Wt 72.6 kg   SpO2 98%   BMI 32.32 kg/m   Physical Exam Vitals signs and nursing note reviewed.  Constitutional:      Appearance: She is well-developed.     Comments: Overweight, overall nontoxic-appearing, ABCs intact  HENT:     Head: Normocephalic and atraumatic.     Mouth/Throat:     Mouth: Mucous membranes are moist.  Eyes:     Pupils: Pupils are equal, round, and reactive to light.  Neck:     Musculoskeletal: Neck supple.  Cardiovascular:     Rate and Rhythm: Normal rate and regular rhythm.     Heart sounds: Normal heart sounds.  Pulmonary:     Effort: Pulmonary effort is normal. No respiratory distress.     Breath sounds: No wheezing.  Abdominal:     General: Bowel sounds are normal.     Palpations: Abdomen is soft.  Skin:    General: Skin is warm and dry.  Neurological:     Mental Status: She is alert and oriented to person, place, and time.  Psychiatric:        Mood and Affect: Mood is anxious.      ED Treatments / Results  Labs (all labs ordered are listed, but only abnormal results are displayed) Labs Reviewed  COMPREHENSIVE METABOLIC PANEL - Abnormal; Notable for the following components:      Result Value   Calcium  8.8 (*)    All other components within normal limits  CBC WITH DIFFERENTIAL/PLATELET  LIPASE, BLOOD    EKG EKG Interpretation  Date/Time:  Sunday September 15 2019 02:07:43 EST Ventricular Rate:  79 PR Interval:    QRS Duration: 80 QT Interval:  361 QTC Calculation: 414 R Axis:   40 Text Interpretation: Sinus rhythm Low voltage, precordial leads Confirmed by Ross MarcusHorton, Albin Duckett ([id number]) on 09/15/2019 2:39:22 AM   Radiology Dg Chest Portable 1 View  Result Date: 09/15/2019 CLINICAL DATA:  Chest pain and reflux EXAM: PORTABLE CHEST 1 VIEW COMPARISON:  April 03, 2019 FINDINGS: The heart size and mediastinal contours are within normal limits. Both lungs are clear. The visualized skeletal structures are unremarkable. IMPRESSION: No active disease. Electronically Signed   By: Prudencio Pair M.D.   On: 09/15/2019 01:58    Procedures Procedures (including critical care time)  Medications Ordered in ED Medications  pantoprazole (PROTONIX) injection 40 mg (40 mg Intravenous Given 09/15/19 0151)  alum & mag hydroxide-simeth (MAALOX/MYLANTA) 200-200-20 MG/5ML suspension 30 mL (30 mLs Oral Given 09/15/19 0228)    And  lidocaine (XYLOCAINE) 2 % viscous mouth solution 15 mL (15 mLs Oral Given 09/15/19 0228)     Initial Impression / Assessment and Plan / ED Course  I have reviewed the triage vital signs and the nursing notes.  Pertinent labs & imaging results that were available during my care of the patient were reviewed by me and considered in my medical decision making (see chart for details).        Patient presents with symptoms that she relates to her reflux.  Reports ongoing worsening symptoms of cough, dysphagia, and sore throat.  Worse at night.  Acutely worse this evening.  She is overall nontoxic and vital signs are reassuring.  She is followed closely by gastroenterology and is due for a pH study.  Recent change in her PPI.  EKG is nonischemic.  Chest x-ray does not show any evidence of  effusion or Boerhaave's.  Doubt ACS.  Patient was initially given IV Protonix.  She was subsequent get an GI cocktail and reports some improvement of her symptoms.  Her basic lab work including CMP and lipase are reassuring.  I discussed with her that she needs to continue with her medications as prescribed.  Avoid foods that provoke her symptoms and sleep as upright as possible until she can follow-up with GI.  Patient states understanding.  After history, exam, and medical workup I feel the patient has been appropriately medically screened and is safe for discharge home. Pertinent diagnoses were discussed with the patient. Patient was given return precautions.   Final Clinical Impressions(s) / ED Diagnoses   Final diagnoses:  Gastroesophageal reflux disease, unspecified whether esophagitis present    ED Discharge Orders    None       Merryl Hacker, MD 09/15/19 [phone]

## 2020-02-29 ENCOUNTER — Emergency Department (HOSPITAL_BASED_OUTPATIENT_CLINIC_OR_DEPARTMENT_OTHER)
Admission: EM | Admit: 2020-02-29 | Discharge: 2020-02-29 | Disposition: A | Discharge status: Home / Self Care | Payer: PRIVATE HEALTH INSURANCE | Attending: Emergency Medicine | Admitting: Emergency Medicine

## 2020-02-29 ENCOUNTER — Encounter (HOSPITAL_BASED_OUTPATIENT_CLINIC_OR_DEPARTMENT_OTHER): Payer: Self-pay | Admitting: Emergency Medicine

## 2020-02-29 ENCOUNTER — Emergency Department (HOSPITAL_BASED_OUTPATIENT_CLINIC_OR_DEPARTMENT_OTHER): Payer: PRIVATE HEALTH INSURANCE

## 2020-02-29 ENCOUNTER — Other Ambulatory Visit: Payer: Self-pay

## 2020-02-29 DIAGNOSIS — S161XXA Strain of muscle, fascia and tendon at neck level, initial encounter: Secondary | ICD-10-CM | POA: Insufficient documentation

## 2020-02-29 DIAGNOSIS — S39012A Strain of muscle, fascia and tendon of lower back, initial encounter: Secondary | ICD-10-CM

## 2020-02-29 DIAGNOSIS — Y9389 Activity, other specified: Secondary | ICD-10-CM | POA: Insufficient documentation

## 2020-02-29 DIAGNOSIS — W01118A Fall on same level from slipping, tripping and stumbling with subsequent striking against other sharp object, initial encounter: Secondary | ICD-10-CM | POA: Insufficient documentation

## 2020-02-29 DIAGNOSIS — J45909 Unspecified asthma, uncomplicated: Secondary | ICD-10-CM | POA: Insufficient documentation

## 2020-02-29 DIAGNOSIS — S0990XA Unspecified injury of head, initial encounter: Secondary | ICD-10-CM | POA: Diagnosis present

## 2020-02-29 DIAGNOSIS — S0993XA Unspecified injury of face, initial encounter: Secondary | ICD-10-CM

## 2020-02-29 DIAGNOSIS — S8002XA Contusion of left knee, initial encounter: Secondary | ICD-10-CM | POA: Insufficient documentation

## 2020-02-29 DIAGNOSIS — S060X1A Concussion with loss of consciousness of 30 minutes or less, initial encounter: Secondary | ICD-10-CM | POA: Insufficient documentation

## 2020-02-29 DIAGNOSIS — Y999 Unspecified external cause status: Secondary | ICD-10-CM | POA: Insufficient documentation

## 2020-02-29 DIAGNOSIS — S60222A Contusion of left hand, initial encounter: Secondary | ICD-10-CM | POA: Insufficient documentation

## 2020-02-29 DIAGNOSIS — Z79899 Other long term (current) drug therapy: Secondary | ICD-10-CM | POA: Insufficient documentation

## 2020-02-29 DIAGNOSIS — W19XXXA Unspecified fall, initial encounter: Secondary | ICD-10-CM

## 2020-02-29 DIAGNOSIS — Y9289 Other specified places as the place of occurrence of the external cause: Secondary | ICD-10-CM | POA: Diagnosis not present

## 2020-02-29 DIAGNOSIS — S0083XA Contusion of other part of head, initial encounter: Secondary | ICD-10-CM

## 2020-02-29 DIAGNOSIS — S025XXA Fracture of tooth (traumatic), initial encounter for closed fracture: Secondary | ICD-10-CM | POA: Diagnosis not present

## 2020-02-29 LAB — BASIC METABOLIC PANEL
Anion gap: 10 (ref 5–15)
BUN: 9 mg/dL (ref 6–20)
CO2: 24 mmol/L (ref 22–32)
Calcium: 8.5 mg/dL — ABNORMAL LOW (ref 8.9–10.3)
Chloride: 106 mmol/L (ref 98–111)
Creatinine, Ser: 0.56 mg/dL (ref 0.44–1.00)
GFR calc Af Amer: >60 mL/min (ref >60)
GFR calc non Af Amer: >60 mL/min (ref >60)
Glucose, Bld: 91 mg/dL (ref 70–99)
Potassium: 3.7 mmol/L (ref 3.5–5.1)
Sodium: 140 mmol/L (ref 135–145)

## 2020-02-29 LAB — CBC WITH DIFFERENTIAL/PLATELET
Abs Immature Granulocytes: 0.03 10*3/uL (ref 0.00–0.07)
Basophils Absolute: 0 10*3/uL (ref 0.0–0.1)
Basophils Relative: 0 %
Eosinophils Absolute: 0.2 10*3/uL (ref 0.0–0.5)
Eosinophils Relative: 2 %
HCT: 41.8 % (ref 36.0–46.0)
Hemoglobin: 13 g/dL (ref 12.0–15.0)
Immature Granulocytes: 0 %
Lymphocytes Relative: 22 %
Lymphs Abs: 2 10*3/uL (ref 0.7–4.0)
MCH: 27.5 pg (ref 26.0–34.0)
MCHC: 31.1 g/dL (ref 30.0–36.0)
MCV: 88.4 fL (ref 80.0–100.0)
Monocytes Absolute: 0.7 10*3/uL (ref 0.1–1.0)
Monocytes Relative: 8 %
Neutro Abs: 6 10*3/uL (ref 1.7–7.7)
Neutrophils Relative %: 68 %
Platelets: 318 10*3/uL (ref 150–400)
RBC: 4.73 MIL/uL (ref 3.87–5.11)
RDW: 12.7 % (ref 11.5–15.5)
WBC: 9 10*3/uL (ref 4.0–10.5)
nRBC: 0 % (ref 0.0–0.2)

## 2020-02-29 LAB — URINALYSIS, ROUTINE W REFLEX MICROSCOPIC
Bilirubin Urine: NEGATIVE
Glucose, UA: NEGATIVE mg/dL
Hgb urine dipstick: NEGATIVE
Ketones, ur: 15 mg/dL — AB
Leukocytes,Ua: NEGATIVE
Nitrite: NEGATIVE
Protein, ur: NEGATIVE mg/dL
Specific Gravity, Urine: 1.02 (ref 1.005–1.030)
pH: 6 (ref 5.0–8.0)

## 2020-02-29 LAB — PREGNANCY, URINE: Preg Test, Ur: NEGATIVE

## 2020-02-29 MED ORDER — IBUPROFEN 600 MG PO TABS: 600.0000 mg | ORAL_TABLET | Freq: Four times a day (QID) | ORAL | 0 refills | Status: AC | PRN

## 2020-02-29 MED ORDER — HYDROCODONE-ACETAMINOPHEN 5-325 MG PO TABS: 1.0000 | ORAL_TABLET | ORAL | 0 refills | Status: AC | PRN

## 2020-02-29 MED ORDER — DIAZEPAM 5 MG PO TABS: 5.0000 mg | ORAL_TABLET | Freq: Two times a day (BID) | ORAL | 0 refills | Status: AC

## 2020-02-29 MED ORDER — KETOROLAC TROMETHAMINE 30 MG/ML IJ SOLN
30.0000 mg | Freq: Once | INTRAMUSCULAR | Status: AC
  Administered 2020-02-29: 30 mg via INTRAVENOUS
  Filled 2020-02-29: qty 1

## 2020-02-29 MED ORDER — SODIUM CHLORIDE 0.9 % IV BOLUS
1000.0000 mL | Freq: Once | INTRAVENOUS | Status: AC
  Administered 2020-02-29: 1000 mL via INTRAVENOUS

## 2020-02-29 NOTE — ED Provider Notes (Signed)
[facility] EMERGENCY DEPARTMENT Provider Note   CSN: [id number] Arrival date & time: 02/29/20  1404     History Chief Complaint  Patient presents with  . Fall    Michele Roberts is a 44 y.o. female.  Pt presents to the ED today with a fall.  She tripped on some concrete last night and fell face first.  She said it happened so fast she did not have time to brace herself.  She hit her face and head and chipped a few teeth.  Pt also has pain to her neck, left hand, left thigh, left knee, and back.  She said she's not been able to drink or eat since she fell because she has pain in her jaw and lips.  She did have a brief loc and continues to have a headache.        Past Medical History:  Diagnosis Date  . Asthma   . GERD (gastroesophageal reflux disease)   . Migraines   . Migraines     Patient Active Problem List   Diagnosis Date Noted  . IBS (irritable bowel syndrome) 08/29/2011  . ADJUSTMENT DISORDER WITH DEPRESSED MOOD 08/26/2010  . HELICOBACTER PYLORI INFECTION 07/21/2010  . MIGRAINE, MENSTRUAL 07/21/2010  . GERD 07/21/2010  . THYROID FUNCTION TEST, ABNORMAL 07/21/2010  . ALLERGIC REACTION 07/21/2010    Past Surgical History:  Procedure Laterality Date  . APPENDECTOMY    . CESAREAN SECTION       OB History   No obstetric history on file.     Family History  Problem Relation Age of Onset  . Stroke Maternal Grandmother   . Hypertension Neg Hx   . Diabetes Neg Hx   . Cancer Neg Hx     Social History   Tobacco Use  . Smoking status: Never Smoker  . Smokeless tobacco: Never Used  Substance Use Topics  . Alcohol use: No  . Drug use: No    Home Medications Prior to Admission medications   Medication Sig Start Date End Date Taking? Authorizing Provider  albuterol (PROVENTIL HFA;VENTOLIN HFA) 108 (90 Base) MCG/ACT inhaler Inhale 2 puffs into the lungs every 6 (six) hours as needed for wheezing or shortness of breath. 11/28/17   Sandford Craze, NP  aspirin-acetaminophen-caffeine (EXCEDRIN MIGRAINE) [phone] MG tablet Take 1 tablet by mouth as needed for headache.    [provider]  cetirizine (ZYRTEC) 10 MG tablet Take 10 mg by mouth daily.    [provider]  diazepam (VALIUM) 5 MG tablet Take 1 tablet (5 mg total) by mouth 2 (two) times daily. 02/29/20   Jacalyn Lefevre, MD  famotidine (PEPCID) 40 MG tablet Take by mouth. 09/04/19 10/04/19  [provider]  fluticasone (FLONASE) 50 MCG/ACT nasal spray Place 1 spray into both nostrils daily. 10/17/18   Copland, Gwenlyn Found, MD  HYDROcodone-acetaminophen (NORCO/VICODIN) 5-325 MG tablet Take 1 tablet by mouth every 4 (four) hours as needed. 02/29/20   Jacalyn Lefevre, MD  ibuprofen (ADVIL) 600 MG tablet Take 1 tablet (600 mg total) by mouth every 6 (six) hours as needed. 02/29/20   Jacalyn Lefevre, MD  levonorgestrel (MIRENA) 20 MCG/24HR IUD by Intrauterine route.    [provider]  montelukast (SINGULAIR) 10 MG tablet Take 1 tablet (10 mg total) by mouth at bedtime. 10/17/18   Copland, Gwenlyn Found, MD  omeprazole (PRILOSEC) 40 MG capsule Take 1 capsule (40 mg total) by mouth daily. Use for 4-8 weeks as needed for GERD 11/13/18  Copland, Gay Filler, MD  RABEprazole (ACIPHEX) 20 MG tablet Take 20 mg by mouth daily.    [provider]  rizatriptan (MAXALT) 5 MG tablet Take 1 tablet (5 mg total) by mouth as needed for migraine. Max 30 mg per day 10/17/18   Copland, Gay Filler, MD  traZODone (DESYREL) 50 MG tablet Take 0.5-1 tablets (25-50 mg total) by mouth at bedtime as needed for sleep. 10/17/18   Copland, Gay Filler, MD    Allergies    Compazine [prochlorperazine edisylate], Amoxicillin, Banana, Latex, Other, Pineapple, and Shrimp [shellfish allergy]  Review of Systems   Review of Systems  HENT: Positive for dental problem and facial swelling.   Musculoskeletal: Positive for back pain and neck pain.       Left knee, left thumb, left thigh pain   All other systems reviewed and are negative.   Physical Exam Updated Vital Signs BP 105/65 (BP Location: Left Arm)   Pulse 79   Temp 98.5 F (36.9 C) (Oral)   Resp 16   Ht 4\' 11"  (1.499 m)   Wt 77.1 kg   SpO2 100%   BMI 34.34 kg/m   Physical Exam Vitals and nursing note reviewed.  Constitutional:      Appearance: Normal appearance.  HENT:     Head:     Jaw: There is normal jaw occlusion. Tenderness present.      Right Ear: External ear normal.     Left Ear: External ear normal.     Nose: Signs of injury and nasal tenderness present.     Mouth/Throat:     Mouth: Mucous membranes are moist.     Comments: Chipped tooth   Swollen upper and lower lips with abrasions Eyes:     Extraocular Movements: Extraocular movements intact.     Conjunctiva/sclera: Conjunctivae normal.     Pupils: Pupils are equal, round, and reactive to light.  Neck:   Cardiovascular:     Rate and Rhythm: Normal rate and regular rhythm.     Pulses: Normal pulses.     Heart sounds: Normal heart sounds.  Pulmonary:     Effort: Pulmonary effort is normal.     Breath sounds: Normal breath sounds.  Abdominal:     General: Abdomen is flat. Bowel sounds are normal.     Palpations: Abdomen is soft.  Musculoskeletal:       Arms:     Cervical back: Pain with movement and muscular tenderness present.       Back:       Legs:  Skin:    General: Skin is warm.     Capillary Refill: Capillary refill takes less than 2 seconds.  Neurological:     General: No focal deficit present.     Mental Status: She is alert and oriented to person, place, and time.  Psychiatric:        Mood and Affect: Mood normal.        Behavior: Behavior normal.        Thought Content: Thought content normal.        Judgment: Judgment normal.     ED Results / Procedures / Treatments   Labs (all labs ordered are listed, but only abnormal results are displayed) Labs Reviewed  URINALYSIS, ROUTINE W REFLEX MICROSCOPIC -  Abnormal; Notable for the following components:      Result Value   Ketones, ur 15 (*)    All other components within normal limits  BASIC METABOLIC PANEL -  Abnormal; Notable for the following components:   Calcium 8.5 (*)    All other components within normal limits  PREGNANCY, URINE  CBC WITH DIFFERENTIAL/PLATELET    EKG None  Radiology DG Chest 2 View  Result Date: 02/29/2020 CLINICAL DATA:  Left upper chest soreness post fall. EXAM: CHEST - 2 VIEW COMPARISON:  September 24, 2019 FINDINGS: The heart size and mediastinal contours are within normal limits. Both lungs are clear. The visualized skeletal structures are unremarkable. IMPRESSION: No active cardiopulmonary disease. Electronically Signed   By: Ted Mcalpine M.D.   On: 02/29/2020 18:15   DG Lumbar Spine Complete  Result Date: 02/29/2020 CLINICAL DATA:  Pain after fall EXAM: LEFT HAND - COMPLETE 3+ VIEW; LUMBAR SPINE - COMPLETE 4+ VIEW; LEFT FEMUR 2 VIEWS COMPARISON:  None. FINDINGS: An IUD is identified. A calcification in the right pelvis is nonspecific but probably a phlebolith. No fracture or traumatic malalignment. Multilevel degenerative disc disease. No other acute abnormalities. IMPRESSION: 1. Degenerative disc disease. No acute fracture or traumatic malalignment. Electronically Signed   By: Gerome Sam III M.D   On: 02/29/2020 18:23   CT Head Wo Contrast  Result Date: 02/29/2020 CLINICAL DATA:  Facial trauma, trip and fall, abrasion to lip and nose, chipped tooth EXAM: CT HEAD WITHOUT CONTRAST CT MAXILLOFACIAL WITHOUT CONTRAST CT CERVICAL SPINE WITHOUT CONTRAST TECHNIQUE: Multidetector CT imaging of the head, cervical spine, and maxillofacial structures were performed using the standard protocol without intravenous contrast. Multiplanar CT image reconstructions of the cervical spine and maxillofacial structures were also generated. COMPARISON:  04/03/2019 FINDINGS: CT HEAD FINDINGS Brain: No evidence of acute  infarction, hemorrhage, hydrocephalus, extra-axial collection or mass lesion/mass effect. Vascular: No hyperdense vessel or unexpected calcification. CT FACIAL BONES FINDINGS Skull: Normal. Negative for fracture or focal lesion. Facial bones: No displaced fractures or dislocations. Sinuses/Orbits: No acute finding. Other: Soft tissue contusion of the lips and chin. CT CERVICAL SPINE FINDINGS Alignment: Normal. Skull base and vertebrae: No acute fracture. No primary bone lesion or focal pathologic process. Soft tissues and spinal canal: No prevertebral fluid or swelling. No visible canal hematoma. Disc levels: Mild disc space height loss and osteophytosis of the lower cervical spine. Upper chest: Negative. Other: None. IMPRESSION: 1. No acute intracranial pathology. 2. No displaced fractures or dislocations of the facial bones. 3. Soft tissue contusion of the lips and chin. 4. No fracture or static subluxation of the cervical spine. Electronically Signed   By: Lauralyn Primes M.D.   On: 02/29/2020 18:04   CT Cervical Spine Wo Contrast  Result Date: 02/29/2020 CLINICAL DATA:  Facial trauma, trip and fall, abrasion to lip and nose, chipped tooth EXAM: CT HEAD WITHOUT CONTRAST CT MAXILLOFACIAL WITHOUT CONTRAST CT CERVICAL SPINE WITHOUT CONTRAST TECHNIQUE: Multidetector CT imaging of the head, cervical spine, and maxillofacial structures were performed using the standard protocol without intravenous contrast. Multiplanar CT image reconstructions of the cervical spine and maxillofacial structures were also generated. COMPARISON:  04/03/2019 FINDINGS: CT HEAD FINDINGS Brain: No evidence of acute infarction, hemorrhage, hydrocephalus, extra-axial collection or mass lesion/mass effect. Vascular: No hyperdense vessel or unexpected calcification. CT FACIAL BONES FINDINGS Skull: Normal. Negative for fracture or focal lesion. Facial bones: No displaced fractures or dislocations. Sinuses/Orbits: No acute finding. Other: Soft  tissue contusion of the lips and chin. CT CERVICAL SPINE FINDINGS Alignment: Normal. Skull base and vertebrae: No acute fracture. No primary bone lesion or focal pathologic process. Soft tissues and spinal canal: No prevertebral fluid or swelling. No visible  canal hematoma. Disc levels: Mild disc space height loss and osteophytosis of the lower cervical spine. Upper chest: Negative. Other: None. IMPRESSION: 1. No acute intracranial pathology. 2. No displaced fractures or dislocations of the facial bones. 3. Soft tissue contusion of the lips and chin. 4. No fracture or static subluxation of the cervical spine. Electronically Signed   By: Lauralyn PrimesAlex  Bibbey M.D.   On: 02/29/2020 18:04   DG Knee Complete 4 Views Left  Result Date: 02/29/2020 CLINICAL DATA:  Pain after fall. EXAM: LEFT KNEE - COMPLETE 4+ VIEW COMPARISON:  None. FINDINGS: No evidence of fracture, dislocation, or joint effusion. No evidence of arthropathy or other focal bone abnormality. Soft tissues are unremarkable. IMPRESSION: Negative. Electronically Signed   By: Gerome Samavid  Williams III M.D   On: 02/29/2020 18:17   DG Hand Complete Left  Result Date: 02/29/2020 CLINICAL DATA:  Pain after fall EXAM: LEFT HAND - COMPLETE 3+ VIEW; LUMBAR SPINE - COMPLETE 4+ VIEW; LEFT FEMUR 2 VIEWS COMPARISON:  None. FINDINGS: An IUD is identified. A calcification in the right pelvis is nonspecific but probably a phlebolith. No fracture or traumatic malalignment. Multilevel degenerative disc disease. No other acute abnormalities. IMPRESSION: 1. Degenerative disc disease. No acute fracture or traumatic malalignment. Electronically Signed   By: Gerome Samavid  Williams III M.D   On: 02/29/2020 18:23   DG Femur Min 2 Views Left  Result Date: 02/29/2020 CLINICAL DATA:  Pain after fall EXAM: LEFT HAND - COMPLETE 3+ VIEW; LUMBAR SPINE - COMPLETE 4+ VIEW; LEFT FEMUR 2 VIEWS COMPARISON:  None. FINDINGS: An IUD is identified. A calcification in the right pelvis is nonspecific but  probably a phlebolith. No fracture or traumatic malalignment. Multilevel degenerative disc disease. No other acute abnormalities. IMPRESSION: 1. Degenerative disc disease. No acute fracture or traumatic malalignment. Electronically Signed   By: Gerome Samavid  Williams III M.D   On: 02/29/2020 18:23   CT Maxillofacial Wo Contrast  Result Date: 02/29/2020 CLINICAL DATA:  Facial trauma, trip and fall, abrasion to lip and nose, chipped tooth EXAM: CT HEAD WITHOUT CONTRAST CT MAXILLOFACIAL WITHOUT CONTRAST CT CERVICAL SPINE WITHOUT CONTRAST TECHNIQUE: Multidetector CT imaging of the head, cervical spine, and maxillofacial structures were performed using the standard protocol without intravenous contrast. Multiplanar CT image reconstructions of the cervical spine and maxillofacial structures were also generated. COMPARISON:  04/03/2019 FINDINGS: CT HEAD FINDINGS Brain: No evidence of acute infarction, hemorrhage, hydrocephalus, extra-axial collection or mass lesion/mass effect. Vascular: No hyperdense vessel or unexpected calcification. CT FACIAL BONES FINDINGS Skull: Normal. Negative for fracture or focal lesion. Facial bones: No displaced fractures or dislocations. Sinuses/Orbits: No acute finding. Other: Soft tissue contusion of the lips and chin. CT CERVICAL SPINE FINDINGS Alignment: Normal. Skull base and vertebrae: No acute fracture. No primary bone lesion or focal pathologic process. Soft tissues and spinal canal: No prevertebral fluid or swelling. No visible canal hematoma. Disc levels: Mild disc space height loss and osteophytosis of the lower cervical spine. Upper chest: Negative. Other: None. IMPRESSION: 1. No acute intracranial pathology. 2. No displaced fractures or dislocations of the facial bones. 3. Soft tissue contusion of the lips and chin. 4. No fracture or static subluxation of the cervical spine. Electronically Signed   By: Lauralyn PrimesAlex  Bibbey M.D.   On: 02/29/2020 18:04    Procedures Procedures (including  critical care time)  Medications Ordered in ED Medications  sodium chloride 0.9 % bolus 1,000 mL (0 mLs Intravenous Stopped 02/29/20 1620)  ketorolac (TORADOL) 30 MG/ML injection 30 mg (30  mg Intravenous Given 02/29/20 1513)    ED Course  I have reviewed the triage vital signs and the nursing notes.  Pertinent labs & imaging results that were available during my care of the patient were reviewed by me and considered in my medical decision making (see chart for details).    MDM Rules/Calculators/A&P                     Pt is feeling better after fluids and toradol.  No fx.  Pt likely has a mild concussion with sx or headache and +loc.  Pt is instructed to return if worse.  F/u with pcp and with her dentist. Final Clinical Impression(s) / ED Diagnoses Final diagnoses:  Fall, initial encounter  Contusion of face, initial encounter  Concussion with loss of consciousness of 30 minutes or less, initial encounter  Strain of neck muscle, initial encounter  Strain of lumbar region, initial encounter  Contusion of left knee, initial encounter  Contusion of left hand, initial encounter  Dental injury, initial encounter    Rx / DC Orders ED Discharge Orders         Ordered    ibuprofen (ADVIL) 600 MG tablet  Every 6 hours PRN     02/29/20 1837    HYDROcodone-acetaminophen (NORCO/VICODIN) 5-325 MG tablet  Every 4 hours PRN     02/29/20 1837    diazepam (VALIUM) 5 MG tablet  2 times daily     02/29/20 1837           Jacalyn Lefevre, MD 02/29/20 Paulo Fruit

## 2020-02-29 NOTE — ED Notes (Signed)
wtg for u-preg results in order to perform imaging.

## 2020-02-29 NOTE — ED Triage Notes (Signed)
Pt tripped and fell last night, landing face first on cement. Has abrasions and bruising to her face and a broken tooth, L leg, and generalized soreness.

## 2023-12-30 ENCOUNTER — Encounter (HOSPITAL_BASED_OUTPATIENT_CLINIC_OR_DEPARTMENT_OTHER): Payer: Self-pay | Admitting: Urology

## 2023-12-30 ENCOUNTER — Other Ambulatory Visit: Payer: Self-pay

## 2023-12-30 ENCOUNTER — Emergency Department (HOSPITAL_BASED_OUTPATIENT_CLINIC_OR_DEPARTMENT_OTHER): Payer: PRIVATE HEALTH INSURANCE

## 2023-12-30 ENCOUNTER — Emergency Department (HOSPITAL_BASED_OUTPATIENT_CLINIC_OR_DEPARTMENT_OTHER)
Admission: EM | Admit: 2023-12-30 | Discharge: 2023-12-31 | Disposition: A | Discharge status: Home / Self Care | Payer: PRIVATE HEALTH INSURANCE | Attending: Emergency Medicine | Admitting: Emergency Medicine

## 2023-12-30 DIAGNOSIS — M5410 Radiculopathy, site unspecified: Secondary | ICD-10-CM

## 2023-12-30 DIAGNOSIS — M5416 Radiculopathy, lumbar region: Secondary | ICD-10-CM | POA: Diagnosis not present

## 2023-12-30 DIAGNOSIS — R252 Cramp and spasm: Secondary | ICD-10-CM | POA: Diagnosis present

## 2023-12-30 DIAGNOSIS — Z91040 Latex allergy status: Secondary | ICD-10-CM | POA: Diagnosis not present

## 2023-12-30 DIAGNOSIS — J45909 Unspecified asthma, uncomplicated: Secondary | ICD-10-CM | POA: Diagnosis not present

## 2023-12-30 DIAGNOSIS — R531 Weakness: Secondary | ICD-10-CM | POA: Insufficient documentation

## 2023-12-30 DIAGNOSIS — R29898 Other symptoms and signs involving the musculoskeletal system: Secondary | ICD-10-CM

## 2023-12-30 HISTORY — DX: Other chronic pain: G89.29

## 2023-12-30 LAB — CBC WITH DIFFERENTIAL/PLATELET
Abs Immature Granulocytes: 0.04 10*3/uL (ref 0.00–0.07)
Basophils Absolute: 0.1 10*3/uL (ref 0.0–0.1)
Basophils Relative: 0 %
Eosinophils Absolute: 0.1 10*3/uL (ref 0.0–0.5)
Eosinophils Relative: 0 %
HCT: 42.5 % (ref 36.0–46.0)
Hemoglobin: 13.8 g/dL (ref 12.0–15.0)
Immature Granulocytes: 0 %
Lymphocytes Relative: 20 %
Lymphs Abs: 2.3 10*3/uL (ref 0.7–4.0)
MCH: 27.8 pg (ref 26.0–34.0)
MCHC: 32.5 g/dL (ref 30.0–36.0)
MCV: 85.5 fL (ref 80.0–100.0)
Monocytes Absolute: 0.9 10*3/uL (ref 0.1–1.0)
Monocytes Relative: 8 %
Neutro Abs: 8.1 10*3/uL — ABNORMAL HIGH (ref 1.7–7.7)
Neutrophils Relative %: 72 %
Platelets: 375 10*3/uL (ref 150–400)
RBC: 4.97 MIL/uL (ref 3.87–5.11)
RDW: 13.2 % (ref 11.5–15.5)
WBC: 11.5 10*3/uL — ABNORMAL HIGH (ref 4.0–10.5)
nRBC: 0 % (ref 0.0–0.2)

## 2023-12-30 LAB — COMPREHENSIVE METABOLIC PANEL
ALT: 34 U/L (ref 0–44)
AST: 29 U/L (ref 15–41)
Albumin: 4.3 g/dL (ref 3.5–5.0)
Alkaline Phosphatase: 82 U/L (ref 38–126)
Anion gap: 8 (ref 5–15)
BUN: 12 mg/dL (ref 6–20)
CO2: 27 mmol/L (ref 22–32)
Calcium: 9.6 mg/dL (ref 8.9–10.3)
Chloride: 104 mmol/L (ref 98–111)
Creatinine, Ser: 0.75 mg/dL (ref 0.44–1.00)
GFR, Estimated: >60 mL/min (ref >60)
Glucose, Bld: 99 mg/dL (ref 70–99)
Potassium: 3.8 mmol/L (ref 3.5–5.1)
Sodium: 139 mmol/L (ref 135–145)
Total Bilirubin: 0.9 mg/dL (ref 0.0–1.2)
Total Protein: 7.8 g/dL (ref 6.5–8.1)

## 2023-12-30 LAB — TROPONIN I (HIGH SENSITIVITY): Troponin I (High Sensitivity): <2 ng/L (ref <18)

## 2023-12-30 LAB — CK: Total CK: 171 U/L (ref 38–234)

## 2023-12-30 LAB — HCG, SERUM, QUALITATIVE: Preg, Serum: NEGATIVE

## 2023-12-30 MED ORDER — DEXAMETHASONE SODIUM PHOSPHATE 10 MG/ML IJ SOLN
10.0000 mg | Freq: Once | INTRAMUSCULAR | Status: DC
  Filled 2023-12-30: qty 1

## 2023-12-30 MED ORDER — HYDROMORPHONE HCL 1 MG/ML IJ SOLN
0.5000 mg | Freq: Once | INTRAMUSCULAR | Status: AC
  Administered 2023-12-30: 0.5 mg via INTRAVENOUS

## 2023-12-30 MED ORDER — HYDROMORPHONE HCL 1 MG/ML IJ SOLN
1.0000 mg | Freq: Once | INTRAMUSCULAR | Status: DC
  Filled 2023-12-30: qty 1

## 2023-12-30 MED ORDER — KETOROLAC TROMETHAMINE 30 MG/ML IJ SOLN
30.0000 mg | Freq: Once | INTRAMUSCULAR | Status: AC
  Administered 2023-12-30: 30 mg via INTRAVENOUS

## 2023-12-30 MED ORDER — KETOROLAC TROMETHAMINE 60 MG/2ML IM SOLN
60.0000 mg | Freq: Once | INTRAMUSCULAR | Status: DC
  Filled 2023-12-30: qty 2

## 2023-12-30 MED ORDER — DEXAMETHASONE SODIUM PHOSPHATE 10 MG/ML IJ SOLN
10.0000 mg | Freq: Once | INTRAMUSCULAR | Status: AC
  Administered 2023-12-30: 10 mg via INTRAVENOUS

## 2023-12-30 NOTE — ED Triage Notes (Signed)
 Pt states bilateral leg cramping gradually worsening  Is seeing pain management  Experiencing horrible cramps at night over past few months Is unable to lay flat at night for past 2 nights due to pain in right leg Now getting bruising, feels tingling all over body and feels cramping all over now  Had [place] yesterday at [facility] with negative result    H/I spinal injections
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
## 2023-12-30 NOTE — ED Notes (Signed)
 Pt instructed to head straight to [facility] ED, no stops along the way. Pt and family member expressed understanding.

## 2023-12-30 NOTE — Discharge Instructions (Addendum)
 SEEK IMMEDIATE MEDICAL ATTENTION IF: New numbness, tingling, weakness, or problem with the use of your arms or legs.  Severe back pain not relieved with medications.  Change in bowel or bladder control (if you lose control of stool or urine, or if you are unable to urinate) Increasing pain in any areas of the body (such as chest or abdominal pain).  Shortness of breath, dizziness or fainting.  Nausea (feeling sick to your stomach), vomiting, fever, or sweats.

## 2023-12-31 ENCOUNTER — Emergency Department (HOSPITAL_COMMUNITY): Payer: PRIVATE HEALTH INSURANCE

## 2023-12-31 ENCOUNTER — Encounter (HOSPITAL_COMMUNITY): Payer: Self-pay

## 2023-12-31 LAB — TROPONIN I (HIGH SENSITIVITY): Troponin I (High Sensitivity): <2 ng/L (ref <18)

## 2023-12-31 MED ORDER — LORAZEPAM 1 MG PO TABS
1.0000 mg | ORAL_TABLET | Freq: Once | ORAL | Status: AC
  Administered 2023-12-31: 1 mg via ORAL
  Filled 2023-12-31: qty 1

## 2023-12-31 NOTE — ED Provider Notes (Signed)
 MRI has multiple findings that could contribute to her symptoms, but there are no findings to suggest acute need for surgical intervention We will plan for follow-up with neurosurgery.  She is already been referred to neurology and she already has a pain specialist and is on multiple medications Patient safe for discharge   Zadie Rhine, MD 12/31/23 [phone]

## 2023-12-31 NOTE — ED Provider Notes (Signed)
 I assumed care of patient in transfer from outside facility.  Patient is here for MRI spine. She reports multiple complaints including muscle spasms leg pains and hand cramping She is awake alert in no acute distress.  She is able to move all 4 extremities.  Distal pulses equal intact.  Will obtain MRI thoracic and lumbar spine and anticipate discharge if unremarkable.  She already has neurology follow-up   Zadie Rhine, MD 12/31/23 [id number]

## 2023-12-31 NOTE — ED Notes (Signed)
 MRI called and reported that pt was experiencing increased pain during MRI and requesting mwdication. Dr Bebe Shaggy made aware and order placed. Will administer in MRI
